# Patient Record
Sex: Male | Born: 1980 | Race: White | Hispanic: No | Marital: Married | Smoking: Never smoker
Health system: Southern US, Community
[De-identification: ages and names within clinical notes are randomized; demographics above are authoritative.]

## PROBLEM LIST (undated history)

## (undated) DIAGNOSIS — R253 Fasciculation: Secondary | ICD-10-CM

## (undated) DIAGNOSIS — F909 Attention-deficit hyperactivity disorder, unspecified type: Secondary | ICD-10-CM

## (undated) HISTORY — DX: Fasciculation: R25.3

## (undated) HISTORY — DX: Attention-deficit hyperactivity disorder, unspecified type: F90.9

## (undated) HISTORY — PX: WISDOM TOOTH EXTRACTION: SHX21

---

## 2019-01-27 ENCOUNTER — Observation Stay (HOSPITAL_COMMUNITY)
Admission: EM | Admit: 2019-01-27 | Discharge: 2019-01-28 | Disposition: A | Discharge status: Home / Self Care | Payer: Managed Care, Other (non HMO) | Attending: General Surgery | Admitting: General Surgery

## 2019-01-27 ENCOUNTER — Other Ambulatory Visit: Payer: Self-pay

## 2019-01-27 ENCOUNTER — Encounter (HOSPITAL_COMMUNITY): Payer: Self-pay | Admitting: Emergency Medicine

## 2019-01-27 ENCOUNTER — Emergency Department (HOSPITAL_COMMUNITY): Payer: Managed Care, Other (non HMO) | Admitting: Registered Nurse

## 2019-01-27 ENCOUNTER — Encounter (HOSPITAL_COMMUNITY)
Admission: EM | Disposition: A | Discharge status: Home / Self Care | Payer: Self-pay | Source: Home / Self Care | Attending: Emergency Medicine

## 2019-01-27 ENCOUNTER — Emergency Department (HOSPITAL_COMMUNITY): Payer: Managed Care, Other (non HMO)

## 2019-01-27 DIAGNOSIS — R1031 Right lower quadrant pain: Secondary | ICD-10-CM

## 2019-01-27 DIAGNOSIS — K3580 Unspecified acute appendicitis: Secondary | ICD-10-CM | POA: Diagnosis present

## 2019-01-27 DIAGNOSIS — Z1159 Encounter for screening for other viral diseases: Secondary | ICD-10-CM | POA: Insufficient documentation

## 2019-01-27 DIAGNOSIS — K3533 Acute appendicitis with perforation and localized peritonitis, with abscess: Principal | ICD-10-CM | POA: Insufficient documentation

## 2019-01-27 DIAGNOSIS — Z9049 Acquired absence of other specified parts of digestive tract: Secondary | ICD-10-CM

## 2019-01-27 HISTORY — PX: LAPAROSCOPIC APPENDECTOMY: SHX408

## 2019-01-27 LAB — CBC WITH DIFFERENTIAL/PLATELET
Abs Immature Granulocytes: 0.02 10*3/uL (ref 0.00–0.07)
Basophils Absolute: 0 10*3/uL (ref 0.0–0.1)
Basophils Relative: 0 %
Eosinophils Absolute: 0.1 10*3/uL (ref 0.0–0.5)
Eosinophils Relative: 0 %
HCT: 46.4 % (ref 39.0–52.0)
Hemoglobin: 16.6 g/dL (ref 13.0–17.0)
Immature Granulocytes: 0 %
Lymphocytes Relative: 12 %
Lymphs Abs: 1.5 10*3/uL (ref 0.7–4.0)
MCH: 31.4 pg (ref 26.0–34.0)
MCHC: 35.8 g/dL (ref 30.0–36.0)
MCV: 87.9 fL (ref 80.0–100.0)
Monocytes Absolute: 1.1 10*3/uL — ABNORMAL HIGH (ref 0.1–1.0)
Monocytes Relative: 9 %
Neutro Abs: 9.9 10*3/uL — ABNORMAL HIGH (ref 1.7–7.7)
Neutrophils Relative %: 79 %
Platelets: 192 10*3/uL (ref 150–400)
RBC: 5.28 MIL/uL (ref 4.22–5.81)
RDW: 11.8 % (ref 11.5–15.5)
WBC: 12.5 10*3/uL — ABNORMAL HIGH (ref 4.0–10.5)
nRBC: 0 % (ref 0.0–0.2)

## 2019-01-27 LAB — COMPREHENSIVE METABOLIC PANEL
ALT: 17 U/L (ref 0–44)
AST: 29 U/L (ref 15–41)
Albumin: 4.4 g/dL (ref 3.5–5.0)
Alkaline Phosphatase: 69 U/L (ref 38–126)
Anion gap: 10 (ref 5–15)
BUN: 18 mg/dL (ref 6–20)
CO2: 23 mmol/L (ref 22–32)
Calcium: 9.3 mg/dL (ref 8.9–10.3)
Chloride: 105 mmol/L (ref 98–111)
Creatinine, Ser: 1.09 mg/dL (ref 0.61–1.24)
GFR calc Af Amer: >60 mL/min (ref >60)
GFR calc non Af Amer: >60 mL/min (ref >60)
Glucose, Bld: 105 mg/dL — ABNORMAL HIGH (ref 70–99)
Potassium: 4.4 mmol/L (ref 3.5–5.1)
Sodium: 138 mmol/L (ref 135–145)
Total Bilirubin: 0.7 mg/dL (ref 0.3–1.2)
Total Protein: 7.1 g/dL (ref 6.5–8.1)

## 2019-01-27 LAB — URINALYSIS, ROUTINE W REFLEX MICROSCOPIC
Bacteria, UA: NONE SEEN
Bilirubin Urine: NEGATIVE
Glucose, UA: NEGATIVE mg/dL
Ketones, ur: 5 mg/dL — AB
Leukocytes,Ua: NEGATIVE
Nitrite: NEGATIVE
Protein, ur: NEGATIVE mg/dL
Specific Gravity, Urine: 1.015 (ref 1.005–1.030)
pH: 6 (ref 5.0–8.0)

## 2019-01-27 LAB — LIPASE, BLOOD: Lipase: 28 U/L (ref 11–51)

## 2019-01-27 LAB — SARS CORONAVIRUS 2 BY RT PCR (HOSPITAL ORDER, PERFORMED IN ~~LOC~~ HOSPITAL LAB): SARS Coronavirus 2: NEGATIVE

## 2019-01-27 SURGERY — APPENDECTOMY, LAPAROSCOPIC
Anesthesia: General | Site: Abdomen

## 2019-01-27 MED ORDER — OXYCODONE HCL 5 MG/5ML PO SOLN: 5.0000 mg | Freq: Once | ORAL | Status: AC | PRN

## 2019-01-27 MED ORDER — BUPIVACAINE-EPINEPHRINE (PF) 0.25% -1:200000 IJ SOLN
INTRAMUSCULAR | Status: AC
  Filled 2019-01-27: qty 30

## 2019-01-27 MED ORDER — SODIUM CHLORIDE 0.9 % IR SOLN
Status: DC | PRN
  Administered 2019-01-27: 1000 mL

## 2019-01-27 MED ORDER — MORPHINE SULFATE (PF) 4 MG/ML IV SOLN
INTRAVENOUS | Status: AC
  Filled 2019-01-27: qty 1

## 2019-01-27 MED ORDER — ONDANSETRON HCL 4 MG/2ML IJ SOLN
INTRAMUSCULAR | Status: DC | PRN
  Administered 2019-01-27: 4 mg via INTRAVENOUS

## 2019-01-27 MED ORDER — EPHEDRINE 5 MG/ML INJ
INTRAVENOUS | Status: AC
  Filled 2019-01-27: qty 10

## 2019-01-27 MED ORDER — ROCURONIUM BROMIDE 10 MG/ML (PF) SYRINGE
PREFILLED_SYRINGE | INTRAVENOUS | Status: DC | PRN
  Administered 2019-01-27: 10 mg via INTRAVENOUS
  Administered 2019-01-27: 40 mg via INTRAVENOUS

## 2019-01-27 MED ORDER — DEXMEDETOMIDINE HCL 200 MCG/2ML IV SOLN
INTRAVENOUS | Status: DC | PRN
  Administered 2019-01-27: 4 ug via INTRAVENOUS
  Administered 2019-01-27: 8 ug via INTRAVENOUS
  Administered 2019-01-27 (×2): 4 ug via INTRAVENOUS

## 2019-01-27 MED ORDER — LACTATED RINGERS IV SOLN
INTRAVENOUS | Status: DC | PRN
  Administered 2019-01-27 (×2): via INTRAVENOUS

## 2019-01-27 MED ORDER — LIDOCAINE 2% (20 MG/ML) 5 ML SYRINGE
INTRAMUSCULAR | Status: DC | PRN
  Administered 2019-01-27: 60 mg via INTRAVENOUS

## 2019-01-27 MED ORDER — KCL IN DEXTROSE-NACL 20-5-0.45 MEQ/L-%-% IV SOLN
INTRAVENOUS | Status: DC
  Administered 2019-01-27: 20:00:00 via INTRAVENOUS
  Filled 2019-01-27: qty 1000

## 2019-01-27 MED ORDER — ZOLPIDEM TARTRATE 5 MG PO TABS: 5.0000 mg | ORAL_TABLET | Freq: Every evening | ORAL | Status: DC | PRN

## 2019-01-27 MED ORDER — PROMETHAZINE HCL 25 MG/ML IJ SOLN: 6.2500 mg | INTRAMUSCULAR | Status: DC | PRN

## 2019-01-27 MED ORDER — ARTIFICIAL TEARS OPHTHALMIC OINT
TOPICAL_OINTMENT | OPHTHALMIC | Status: AC
  Filled 2019-01-27: qty 3.5

## 2019-01-27 MED ORDER — DIPHENHYDRAMINE HCL 25 MG PO CAPS: 25.0000 mg | ORAL_CAPSULE | Freq: Four times a day (QID) | ORAL | Status: DC | PRN

## 2019-01-27 MED ORDER — DEXAMETHASONE SODIUM PHOSPHATE 10 MG/ML IJ SOLN
INTRAMUSCULAR | Status: DC | PRN
  Administered 2019-01-27: 10 mg via INTRAVENOUS

## 2019-01-27 MED ORDER — ONDANSETRON 4 MG PO TBDP: 4.0000 mg | ORAL_TABLET | Freq: Four times a day (QID) | ORAL | Status: DC | PRN

## 2019-01-27 MED ORDER — CIPROFLOXACIN IN D5W 400 MG/200ML IV SOLN
400.0000 mg | Freq: Once | INTRAVENOUS | Status: AC
  Administered 2019-01-27: 400 mg via INTRAVENOUS
  Filled 2019-01-27: qty 200

## 2019-01-27 MED ORDER — ROCURONIUM BROMIDE 10 MG/ML (PF) SYRINGE
PREFILLED_SYRINGE | INTRAVENOUS | Status: AC
  Filled 2019-01-27: qty 10

## 2019-01-27 MED ORDER — DIPHENHYDRAMINE HCL 50 MG/ML IJ SOLN: 25.0000 mg | Freq: Four times a day (QID) | INTRAMUSCULAR | Status: DC | PRN

## 2019-01-27 MED ORDER — ONDANSETRON HCL 4 MG/2ML IJ SOLN
INTRAMUSCULAR | Status: AC
  Filled 2019-01-27: qty 2

## 2019-01-27 MED ORDER — SUCCINYLCHOLINE CHLORIDE 200 MG/10ML IV SOSY
PREFILLED_SYRINGE | INTRAVENOUS | Status: AC
  Filled 2019-01-27: qty 10

## 2019-01-27 MED ORDER — METRONIDAZOLE IN NACL 5-0.79 MG/ML-% IV SOLN
500.0000 mg | Freq: Once | INTRAVENOUS | Status: AC
  Administered 2019-01-28: 500 mg via INTRAVENOUS
  Filled 2019-01-27: qty 100

## 2019-01-27 MED ORDER — DEXAMETHASONE SODIUM PHOSPHATE 10 MG/ML IJ SOLN
INTRAMUSCULAR | Status: AC
  Filled 2019-01-27: qty 1

## 2019-01-27 MED ORDER — FENTANYL CITRATE (PF) 100 MCG/2ML IJ SOLN
25.0000 ug | INTRAMUSCULAR | Status: DC | PRN
  Administered 2019-01-27: 25 ug via INTRAVENOUS
  Administered 2019-01-27: 50 ug via INTRAVENOUS

## 2019-01-27 MED ORDER — CIPROFLOXACIN IN D5W 400 MG/200ML IV SOLN
400.0000 mg | Freq: Once | INTRAVENOUS | Status: AC
  Administered 2019-01-28: 400 mg via INTRAVENOUS
  Filled 2019-01-27 (×2): qty 200

## 2019-01-27 MED ORDER — MIDAZOLAM HCL 2 MG/2ML IJ SOLN
INTRAMUSCULAR | Status: AC
  Filled 2019-01-27: qty 2

## 2019-01-27 MED ORDER — ENOXAPARIN SODIUM 40 MG/0.4ML ~~LOC~~ SOLN: 40.0000 mg | SUBCUTANEOUS | Status: DC

## 2019-01-27 MED ORDER — OXYCODONE HCL 5 MG PO TABS
ORAL_TABLET | ORAL | Status: AC
  Filled 2019-01-27: qty 1

## 2019-01-27 MED ORDER — SUCCINYLCHOLINE CHLORIDE 200 MG/10ML IV SOSY
PREFILLED_SYRINGE | INTRAVENOUS | Status: DC | PRN
  Administered 2019-01-27: 100 mg via INTRAVENOUS

## 2019-01-27 MED ORDER — DEXMEDETOMIDINE HCL IN NACL 80 MCG/20ML IV SOLN
INTRAVENOUS | Status: AC
  Filled 2019-01-27: qty 20

## 2019-01-27 MED ORDER — METRONIDAZOLE IN NACL 5-0.79 MG/ML-% IV SOLN
500.0000 mg | Freq: Once | INTRAVENOUS | Status: AC
  Administered 2019-01-27: 500 mg via INTRAVENOUS
  Filled 2019-01-27: qty 100

## 2019-01-27 MED ORDER — OXYCODONE HCL 5 MG PO TABS
5.0000 mg | ORAL_TABLET | ORAL | Status: DC | PRN
  Administered 2019-01-27 – 2019-01-28 (×3): 10 mg via ORAL
  Filled 2019-01-27 (×3): qty 2

## 2019-01-27 MED ORDER — TRAMADOL HCL 50 MG PO TABS
50.0000 mg | ORAL_TABLET | Freq: Four times a day (QID) | ORAL | Status: DC | PRN
  Administered 2019-01-28: 50 mg via ORAL
  Filled 2019-01-27: qty 1

## 2019-01-27 MED ORDER — PROPOFOL 10 MG/ML IV BOLUS
INTRAVENOUS | Status: AC
  Filled 2019-01-27: qty 20

## 2019-01-27 MED ORDER — METHOCARBAMOL 500 MG PO TABS
500.0000 mg | ORAL_TABLET | Freq: Four times a day (QID) | ORAL | Status: DC | PRN
  Administered 2019-01-28: 500 mg via ORAL
  Filled 2019-01-27: qty 1

## 2019-01-27 MED ORDER — MORPHINE SULFATE (PF) 4 MG/ML IV SOLN
4.0000 mg | INTRAVENOUS | Status: DC | PRN
  Filled 2019-01-27: qty 1

## 2019-01-27 MED ORDER — CELECOXIB 200 MG PO CAPS
200.0000 mg | ORAL_CAPSULE | Freq: Two times a day (BID) | ORAL | Status: DC
  Administered 2019-01-27 – 2019-01-28 (×2): 200 mg via ORAL
  Filled 2019-01-27 (×2): qty 1

## 2019-01-27 MED ORDER — 0.9 % SODIUM CHLORIDE (POUR BTL) OPTIME
TOPICAL | Status: DC | PRN
  Administered 2019-01-27: 1000 mL

## 2019-01-27 MED ORDER — PROPOFOL 10 MG/ML IV BOLUS
INTRAVENOUS | Status: DC | PRN
  Administered 2019-01-27: 180 mg via INTRAVENOUS

## 2019-01-27 MED ORDER — FENTANYL CITRATE (PF) 250 MCG/5ML IJ SOLN
INTRAMUSCULAR | Status: DC | PRN
  Administered 2019-01-27 (×3): 50 ug via INTRAVENOUS

## 2019-01-27 MED ORDER — OXYCODONE HCL 5 MG PO TABS
5.0000 mg | ORAL_TABLET | Freq: Once | ORAL | Status: AC | PRN
  Administered 2019-01-27: 5 mg via ORAL

## 2019-01-27 MED ORDER — GABAPENTIN 300 MG PO CAPS
300.0000 mg | ORAL_CAPSULE | Freq: Two times a day (BID) | ORAL | Status: DC
  Administered 2019-01-27 – 2019-01-28 (×2): 300 mg via ORAL
  Filled 2019-01-27 (×2): qty 1

## 2019-01-27 MED ORDER — HYDROMORPHONE HCL 1 MG/ML IJ SOLN
1.0000 mg | Freq: Once | INTRAMUSCULAR | Status: AC
  Administered 2019-01-27: 1 mg via INTRAVENOUS
  Filled 2019-01-27: qty 1

## 2019-01-27 MED ORDER — SUGAMMADEX SODIUM 200 MG/2ML IV SOLN
INTRAVENOUS | Status: DC | PRN
  Administered 2019-01-27 (×2): 100 mg via INTRAVENOUS

## 2019-01-27 MED ORDER — FENTANYL CITRATE (PF) 100 MCG/2ML IJ SOLN
25.0000 ug | Freq: Once | INTRAMUSCULAR | Status: AC
  Administered 2019-01-27: 25 ug via INTRAVENOUS
  Filled 2019-01-27: qty 2

## 2019-01-27 MED ORDER — IOHEXOL 300 MG/ML  SOLN
100.0000 mL | Freq: Once | INTRAMUSCULAR | Status: AC | PRN
  Administered 2019-01-27: 100 mL via INTRAVENOUS

## 2019-01-27 MED ORDER — SODIUM CHLORIDE 0.9 % IV BOLUS
1000.0000 mL | Freq: Once | INTRAVENOUS | Status: AC
  Administered 2019-01-27: 1000 mL via INTRAVENOUS

## 2019-01-27 MED ORDER — BUPIVACAINE-EPINEPHRINE 0.25% -1:200000 IJ SOLN
INTRAMUSCULAR | Status: DC | PRN
  Administered 2019-01-27: 10 mL

## 2019-01-27 MED ORDER — ONDANSETRON HCL 4 MG/2ML IJ SOLN: 4.0000 mg | Freq: Four times a day (QID) | INTRAMUSCULAR | Status: DC | PRN

## 2019-01-27 MED ORDER — LIDOCAINE 2% (20 MG/ML) 5 ML SYRINGE
INTRAMUSCULAR | Status: AC
  Filled 2019-01-27: qty 5

## 2019-01-27 MED ORDER — ARTIFICIAL TEARS OPHTHALMIC OINT
TOPICAL_OINTMENT | OPHTHALMIC | Status: DC | PRN
  Administered 2019-01-27: 1 via OPHTHALMIC

## 2019-01-27 MED ORDER — PHENYLEPHRINE 40 MCG/ML (10ML) SYRINGE FOR IV PUSH (FOR BLOOD PRESSURE SUPPORT)
PREFILLED_SYRINGE | INTRAVENOUS | Status: AC
  Filled 2019-01-27: qty 10

## 2019-01-27 MED ORDER — MIDAZOLAM HCL 5 MG/5ML IJ SOLN
INTRAMUSCULAR | Status: DC | PRN
  Administered 2019-01-27: 2 mg via INTRAVENOUS

## 2019-01-27 MED ORDER — MORPHINE SULFATE (PF) 4 MG/ML IV SOLN
4.0000 mg | Freq: Once | INTRAVENOUS | Status: AC
  Administered 2019-01-27: 4 mg via INTRAVENOUS
  Filled 2019-01-27: qty 1

## 2019-01-27 MED ORDER — FENTANYL CITRATE (PF) 250 MCG/5ML IJ SOLN
INTRAMUSCULAR | Status: AC
  Filled 2019-01-27: qty 5

## 2019-01-27 MED ORDER — FENTANYL CITRATE (PF) 100 MCG/2ML IJ SOLN
INTRAMUSCULAR | Status: AC
  Filled 2019-01-27: qty 2

## 2019-01-27 SURGICAL SUPPLY — 42 items
APPLIER CLIP ROT 10 11.4 M/L (STAPLE)
BLADE CLIPPER SURG (BLADE) ×2 IMPLANT
CANISTER SUCT 3000ML PPV (MISCELLANEOUS) ×2 IMPLANT
CHLORAPREP W/TINT 26 (MISCELLANEOUS) ×2 IMPLANT
CLIP APPLIE ROT 10 11.4 M/L (STAPLE) IMPLANT
COVER SURGICAL LIGHT HANDLE (MISCELLANEOUS) ×2 IMPLANT
COVER WAND RF STERILE (DRAPES) ×2 IMPLANT
CUTTER FLEX LINEAR 45M (STAPLE) ×2 IMPLANT
DERMABOND ADVANCED (GAUZE/BANDAGES/DRESSINGS) ×1
DERMABOND ADVANCED .7 DNX12 (GAUZE/BANDAGES/DRESSINGS) ×1 IMPLANT
ELECT REM PT RETURN 9FT ADLT (ELECTROSURGICAL) ×2
ELECTRODE REM PT RTRN 9FT ADLT (ELECTROSURGICAL) ×1 IMPLANT
GLOVE BIO SURGEON STRL SZ8 (GLOVE) ×2 IMPLANT
GLOVE BIOGEL PI IND STRL 8 (GLOVE) ×1 IMPLANT
GLOVE BIOGEL PI INDICATOR 8 (GLOVE) ×1
GOWN STRL REUS W/ TWL LRG LVL3 (GOWN DISPOSABLE) ×2 IMPLANT
GOWN STRL REUS W/ TWL XL LVL3 (GOWN DISPOSABLE) ×1 IMPLANT
GOWN STRL REUS W/TWL LRG LVL3 (GOWN DISPOSABLE) ×2
GOWN STRL REUS W/TWL XL LVL3 (GOWN DISPOSABLE) ×1
KIT BASIN OR (CUSTOM PROCEDURE TRAY) ×2 IMPLANT
KIT TURNOVER KIT B (KITS) ×2 IMPLANT
NEEDLE 22X1 1/2 (OR ONLY) (NEEDLE) ×2 IMPLANT
NS IRRIG 1000ML POUR BTL (IV SOLUTION) ×2 IMPLANT
PAD ARMBOARD 7.5X6 YLW CONV (MISCELLANEOUS) ×4 IMPLANT
POUCH RETRIEVAL ECOSAC 10 (ENDOMECHANICALS) ×1 IMPLANT
POUCH RETRIEVAL ECOSAC 10MM (ENDOMECHANICALS) ×1
RELOAD 45 VASCULAR/THIN (ENDOMECHANICALS) ×2 IMPLANT
RELOAD STAPLE TA45 3.5 REG BLU (ENDOMECHANICALS) ×2 IMPLANT
SCISSORS LAP 5X35 DISP (ENDOMECHANICALS) IMPLANT
SET IRRIG TUBING LAPAROSCOPIC (IRRIGATION / IRRIGATOR) ×2 IMPLANT
SET TUBE SMOKE EVAC HIGH FLOW (TUBING) ×2 IMPLANT
SHEARS HARMONIC ACE PLUS 36CM (ENDOMECHANICALS) ×2 IMPLANT
SPECIMEN JAR SMALL (MISCELLANEOUS) ×2 IMPLANT
SUT VIC AB 4-0 PS2 27 (SUTURE) ×2 IMPLANT
TOWEL GREEN STERILE (TOWEL DISPOSABLE) ×2 IMPLANT
TOWEL GREEN STERILE FF (TOWEL DISPOSABLE) ×2 IMPLANT
TRAY FOLEY CATH 16FR SILVER (SET/KITS/TRAYS/PACK) ×2 IMPLANT
TRAY LAPAROSCOPIC MC (CUSTOM PROCEDURE TRAY) ×2 IMPLANT
TROCAR XCEL 12X100 BLDLESS (ENDOMECHANICALS) ×2 IMPLANT
TROCAR XCEL BLUNT TIP 100MML (ENDOMECHANICALS) ×2 IMPLANT
TROCAR XCEL NON-BLD 5MMX100MML (ENDOMECHANICALS) ×2 IMPLANT
WATER STERILE IRR 1000ML POUR (IV SOLUTION) ×2 IMPLANT

## 2019-01-27 NOTE — Anesthesia Preprocedure Evaluation (Addendum)
Anesthesia Evaluation  Patient identified by MRN, date of birth, ID band Patient awake    Reviewed: Allergy & Precautions, NPO status , Patient's Chart, lab work & pertinent test results  History of Anesthesia Complications Negative for: history of anesthetic complications  Airway Mallampati: I  TM Distance: >3 FB Neck ROM: Full    Dental  (+) Dental Advisory Given, Teeth Intact   Pulmonary neg pulmonary ROS,    breath sounds clear to auscultation       Cardiovascular negative cardio ROS   Rhythm:Regular Rate:Normal     Neuro/Psych negative neurological ROS  negative psych ROS   GI/Hepatic Neg liver ROS,  Appendicitis    Endo/Other  negative endocrine ROS  Renal/GU negative Renal ROS     Musculoskeletal negative musculoskeletal ROS (+)   Abdominal   Peds  Hematology negative hematology ROS (+)   Anesthesia Other Findings   Reproductive/Obstetrics                            Anesthesia Physical Anesthesia Plan  ASA: I and emergent  Anesthesia Plan: General   Post-op Pain Management:    Induction: Intravenous and Rapid sequence  PONV Risk Score and Plan: 3 and Treatment may vary due to age or medical condition, Ondansetron, Dexamethasone and Midazolam  Airway Management Planned: Oral ETT  Additional Equipment: None  Intra-op Plan:   Post-operative Plan: Extubation in OR  Informed Consent: I have reviewed the patients History and Physical, chart, labs and discussed the procedure including the risks, benefits and alternatives for the proposed anesthesia with the patient or authorized representative who has indicated his/her understanding and acceptance.     Dental advisory given  Plan Discussed with: CRNA and Anesthesiologist  Anesthesia Plan Comments:        Anesthesia Quick Evaluation

## 2019-01-27 NOTE — Op Note (Signed)
01/27/2019  5:24 PM  PATIENT:  Jordan Klein  38 y.o. male  PRE-OPERATIVE DIAGNOSIS:  acute appendicitis  POST-OPERATIVE DIAGNOSIS:  suppurative appendicitis  PROCEDURE:  Procedure(s): APPENDECTOMY LAPAROSCOPIC  SURGEON:  Surgeon(s): Georganna Skeans, MD  ASSISTANTS: none   ANESTHESIA:   local and general  EBL:  Total I/O In: 1000 [IV Piggyback:1000] Out: 20 [Blood:20]  BLOOD ADMINISTERED:none  DRAINS: none   SPECIMEN:  Excision  DISPOSITION OF SPECIMEN:  PATHOLOGY  COUNTS:  YES  DICTATION: .Dragon Dictation Findings: Acute suppurative appendicitis without perforation  Procedure in detail: Informed consent was obtained.  He received intravenous antibiotics.  He was brought to the operating room and general endotracheal anesthesia was administered by the anesthesia staff.  His abdomen was prepped and draped in a sterile fashion.  A timeout procedure was performed.The infraumbilical region was infiltrated with local. Infraumbilical incision was made. Subcutaneous tissues were dissected down revealing the anterior fascia. This was divided sharply along the midline. Peritoneal cavity was entered under direct vision without complication. A 0 Vicryl pursestring was placed around the fascial opening. Hassan trocar was inserted into the abdomen. The abdomen was insufflated with carbon dioxide in standard fashion. Under direct vision a 12 mm left lower quadrant and 5 mm right abdominal port were placed.  Local was used at each port site.  Laparoscopic exploration revealed a very inflamed, but not perforated appendicitis.  The appendix was freed up from some mild adhesions to the lateral abdominal wall.  The mesoappendix was then gradually divided with a harmonic scalpel achieving excellent hemostasis.  The base of the appendix was divided with Endo GIA with a vascular load.  The appendix was placed in the bag and removed from the abdomen.  It was sent to pathology.  The abdomen was then  copiously irrigated.  The staple line was intact.  There was no bleeding.  Irrigation fluid was evacuated.  Laparoscopic exploration revealed no complicating features.  Ports were removed under direct vision.  Pneumoperitoneum was released.  Infraumbilical fascia was closed by tying the pursestring.  All 3 wounds were irrigated and the skin of each was closed with running 4-0 Vicryl followed by Dermabond.  All counts were correct.  He tolerated the procedure well that apparent complication and was taken recovery in stable condition.  PATIENT DISPOSITION:  PACU - hemodynamically stable.   Delay start of Pharmacological VTE agent (>24hrs) due to surgical blood loss or risk of bleeding:  no  Georganna Skeans, MD, MPH, FACS Pager: [phone]  7/11/20205:24 PM

## 2019-01-27 NOTE — H&P (Signed)
Jordan Klein is an 38 y.o. male.   Chief Complaint: RLQ pain HPI: Otherwise healthy [name] woke up with RLQ pain at 6am this morning. It has persisted. No N/V. He came to the ED for evaluation. WBC 12.5. CT A/P shows acute appendicitis without evidence of perforation. I was asked to see him for surgical care. He has not been sick or had fever recently. No contact with known COVID patients.    History reviewed. No pertinent past medical history.  History reviewed. No pertinent surgical history.  No family history on file. Social History:  reports that he has never smoked. He has never used smokeless tobacco. He reports current alcohol use of about 1.0 standard drinks of alcohol per week. He reports that he does not use drugs.  Allergies:  Allergies  Allergen Reactions  . Banana Anaphylaxis, Itching and Swelling  . Kiwi Extract Anaphylaxis, Hives and Itching  . Other Anaphylaxis, Itching and Swelling    Walnuts  . Pineapple Anaphylaxis, Itching and Swelling  . Penicillins Other (See Comments)    Did it involve swelling of the face/tongue/throat, SOB, or low BP? Unknown Did it involve sudden or severe rash/hives, skin peeling, or any reaction on the inside of your mouth or nose? Unknown Did you need to seek medical attention at a hospital or doctor's office? Unknown When did it last happen? CHILDHOOD If all above answers are "NO", may proceed with cephalosporin use.     (Not in a hospital admission)   Results for orders placed or performed during the hospital encounter of 01/27/19 (from the past 48 hour(s))  CBC with Differential     Status: Abnormal   Collection Time: 01/27/19 10:42 AM  Result Value Ref Range   WBC 12.5 (H) 4.0 - 10.5 K/uL   RBC 5.28 4.22 - 5.81 MIL/uL   Hemoglobin 16.6 13.0 - 17.0 g/dL   HCT 16.146.4 09.639.0 - 04.552.0 %   MCV 87.9 80.0 - 100.0 fL   MCH 31.4 26.0 - 34.0 pg   MCHC 35.8 30.0 - 36.0 g/dL   RDW 40.911.8 81.111.5 - 91.415.5 %   Platelets 192 150 - 400 K/uL    nRBC 0.0 0.0 - 0.2 %   Neutrophils Relative % 79 %   Neutro Abs 9.9 (H) 1.7 - 7.7 K/uL   Lymphocytes Relative 12 %   Lymphs Abs 1.5 0.7 - 4.0 K/uL   Monocytes Relative 9 %   Monocytes Absolute 1.1 (H) 0.1 - 1.0 K/uL   Eosinophils Relative 0 %   Eosinophils Absolute 0.1 0.0 - 0.5 K/uL   Basophils Relative 0 %   Basophils Absolute 0.0 0.0 - 0.1 K/uL   Immature Granulocytes 0 %   Abs Immature Granulocytes 0.02 0.00 - 0.07 K/uL    Comment: Performed at [facility], [address] [place], [place] [address]  Comprehensive metabolic panel     Status: Abnormal   Collection Time: 01/27/19 10:42 AM  Result Value Ref Range   Sodium 138 135 - 145 mmol/L   Potassium 4.4 3.5 - 5.1 mmol/L    Comment: SLIGHT HEMOLYSIS   Chloride 105 98 - 111 mmol/L   CO2 23 22 - 32 mmol/L   Glucose, Bld 105 (H) 70 - 99 mg/dL   BUN 18 6 - 20 mg/dL   Creatinine, Ser 6.211.09 0.61 - 1.24 mg/dL   Calcium 9.3 8.9 - 30.810.3 mg/dL   Total Protein 7.1 6.5 - 8.1 g/dL   Albumin 4.4 3.5 -  5.0 g/dL   AST 29 15 - 41 U/L   ALT 17 0 - 44 U/L   Alkaline Phosphatase 69 38 - 126 U/L   Total Bilirubin 0.7 0.3 - 1.2 mg/dL   GFR calc non Af Amer >60 >60 mL/min   GFR calc Af Amer >60 >60 mL/min   Anion gap 10 5 - 15    Comment: Performed at [facility], [address] [place], [place] [address]  Lipase, blood     Status: None   Collection Time: 01/27/19 10:42 AM  Result Value Ref Range   Lipase 28 11 - 51 U/L    Comment: Performed at [facility], [address] [place], [place] [address]  Urinalysis, Routine w reflex microscopic     Status: Abnormal   Collection Time: 01/27/19  2:00 PM  Result Value Ref Range   Color, Urine STRAW (A) YELLOW   APPearance CLEAR CLEAR   Specific Gravity, Urine 1.015 1.005 - 1.030   pH 6.0 5.0 - 8.0   Glucose, UA NEGATIVE NEGATIVE mg/dL   Hgb urine dipstick SMALL (A) NEGATIVE   Bilirubin Urine NEGATIVE NEGATIVE   Ketones, ur 5 (A) NEGATIVE mg/dL   Protein, ur NEGATIVE  NEGATIVE mg/dL   Nitrite NEGATIVE NEGATIVE   Leukocytes,Ua NEGATIVE NEGATIVE   RBC / HPF 0-5 0 - 5 RBC/hpf   Bacteria, UA NONE SEEN NONE SEEN   Mucus PRESENT     Comment: Performed at [facility], [address] [place], [place] [address]   Ct Abdomen Pelvis W Contrast  Result Date: 01/27/2019 CLINICAL DATA:  Abdominal pain EXAM: CT ABDOMEN AND PELVIS WITH CONTRAST TECHNIQUE: Multidetector CT imaging of the abdomen and pelvis was performed using the standard protocol following bolus administration of intravenous contrast. CONTRAST:  100mL OMNIPAQUE IOHEXOL 300 MG/ML  SOLN COMPARISON:  None. FINDINGS: Lower chest: Lung bases are clear. Hepatobiliary: No focal liver lesions are appreciable. The gallbladder wall is not appreciably thickened. There is no biliary duct dilatation. Pancreas: There is no pancreatic mass or inflammatory focus. Spleen: No splenic lesions are evident. Adrenals/Urinary Tract: Adrenals bilaterally appear normal. Kidneys bilaterally show no evident mass or hydronephrosis on either side. There are fetal lobulations bilaterally, an anatomic variant. There is no evident renal or ureteral calculus on either side. Urinary bladder is midline with wall thickness within normal limits. Stomach/Bowel: There is no appreciable bowel wall or mesenteric thickening. There is no evident bowel obstruction. No free air or portal venous air. The terminal ileum appears unremarkable. Vascular/Lymphatic: No abdominal aortic aneurysm. No vascular lesions evident. No adenopathy is appreciable in the abdomen or pelvis. Reproductive: Prostate is mildly prominent for age with mild generalized increase in attenuation. Seminal vesicles appear unremarkable. No evident pelvic mass. Other: Appendix measures 13 mm proximally. There are several foci of calcification in the appendix. There is mild appendiceal wall thickening and enhancement. There is no periappendiceal region fluid or abscess. No perforation  evident. No abscess or ascites evident in the abdomen or pelvis. Musculoskeletal: There are no blastic or lytic bone lesions. There is no intramuscular or abdominal wall lesion. IMPRESSION: 1.  Findings indicative of a degree of acute appendicitis. Appendix: Location: Appendix arises inferiorly from the cecum and tracks anterior to the psoas muscle at the level of the mid sacrum. Diameter: 13 mm proximally with tapering more distally. Appendicolith: Present proximally and in the mid appendix. Mucosal hyper-enhancement: Slight Extraluminal gas: None Periappendiceal collection: None. Subtle soft tissue  stranding adjacent to the proximal appendix. 2. Prostate mildly prominent for age with increased attenuation. No focal prostatic lesion evident. This finding may warrant PSA correlation and clinical assessment. 3.  No bowel obstruction.  No abscess in the abdomen or pelvis. 4. No renal or ureteral calculus. No hydronephrosis. Urinary bladder wall thickness within normal limits. Critical Value/emergent results were called by telephone at the time of interpretation on 01/27/2019 at 1:24 pm to Dr. Virgel Manifold , who verbally acknowledged these results. Electronically Signed   By: Lowella Grip III M.D.   On: 01/27/2019 13:24    Review of Systems  Constitutional: Negative for chills and fever.  HENT: Negative.   Eyes: Negative.   Respiratory: Negative.   Cardiovascular: Negative.   Gastrointestinal: Positive for abdominal pain. Negative for constipation, diarrhea, nausea and vomiting.  Genitourinary: Negative.   Musculoskeletal: Negative.   Skin: Negative.   Neurological: Negative.   Endo/Heme/Allergies: Negative.   Psychiatric/Behavioral: Negative.     Blood pressure (!) 100/56, pulse 87, temperature 98.2 F (36.8 C), temperature source Oral, resp. rate 16, height 5\' 6"  (1.676 m), weight 59 kg, SpO2 99 %. Physical Exam  Constitutional: He is oriented to person, place, and time. He appears  well-developed and well-nourished. No distress.  HENT:  Head: Normocephalic.  Right Ear: External ear normal.  Left Ear: External ear normal.  Nose: Nose normal.  Mouth/Throat: Oropharynx is clear and moist.  Eyes: EOM are normal. Right eye exhibits no discharge. Left eye exhibits no discharge.  Neck: Neck supple. No tracheal deviation present. No thyromegaly present.  Cardiovascular: Normal rate, regular rhythm and normal heart sounds.  Respiratory: Effort normal and breath sounds normal. No respiratory distress. He has no wheezes. He has no rales.  GI: Soft. He exhibits no distension. There is abdominal tenderness. There is no rebound and no guarding.  Quite tender RLQ without peritonitis  Musculoskeletal: Normal range of motion.        General: No edema.  Neurological: He is alert and oriented to person, place, and time.  Skin: Skin is warm.  Psychiatric: He has a normal mood and affect.     Assessment/Plan Acute appendicitis - IV Cipro and Flagyl. To OR for laparoscopic appendectomy once COVID test completed. Procedure, risks, and benefits discussed and he agrees.  Zenovia Jarred, MD 01/27/2019, 2:33 PM

## 2019-01-27 NOTE — Anesthesia Procedure Notes (Signed)
Procedure Name: Intubation Date/Time: 01/27/2019 4:40 PM Performed by: Jearld Pies, CRNA Pre-anesthesia Checklist: Patient identified, Emergency Drugs available, Suction available and Patient being monitored Patient Re-evaluated:Patient Re-evaluated prior to induction Oxygen Delivery Method: Circle System Utilized Preoxygenation: Pre-oxygenation with 100% oxygen Induction Type: IV induction, Rapid sequence and Cricoid Pressure applied Laryngoscope Size: Mac and 3 Grade View: Grade I Tube type: Oral Tube size: 7.0 mm Number of attempts: 1 Airway Equipment and Method: Stylet and Oral airway Placement Confirmation: ETT inserted through vocal cords under direct vision,  positive ETCO2 and breath sounds checked- equal and bilateral Secured at: 21 cm Tube secured with: Tape Dental Injury: Teeth and Oropharynx as per pre-operative assessment

## 2019-01-27 NOTE — Transfer of Care (Signed)
Immediate Anesthesia Transfer of Care Note  Patient: [name]  Procedure(s) Performed: APPENDECTOMY LAPAROSCOPIC (N/A Abdomen)  Patient Location: PACU  Anesthesia Type:General  Level of Consciousness: drowsy and responds to stimulation  Airway & Oxygen Therapy: Patient Spontanous Breathing and Patient connected to face mask oxygen  Post-op Assessment: Report given to RN and Post -op Vital signs reviewed and stable  Post vital signs: Reviewed and stable  Last Vitals:  Vitals Value Taken Time  BP 98/59 01/27/19 1736  Temp    Pulse 85 01/27/19 1737  Resp 18 01/27/19 1737  SpO2 100 % 01/27/19 1737  Vitals shown include unvalidated device data.  Last Pain:  Vitals:   01/27/19 1456  TempSrc:   PainSc: 6          Complications: No apparent anesthesia complications

## 2019-01-27 NOTE — Anesthesia Postprocedure Evaluation (Signed)
Anesthesia Post Note  Patient: Jordan Klein  Procedure(s) Performed: APPENDECTOMY LAPAROSCOPIC (N/A Abdomen)     Patient location during evaluation: PACU Anesthesia Type: General Level of consciousness: awake and alert Pain management: pain level controlled Vital Signs Assessment: post-procedure vital signs reviewed and stable Respiratory status: spontaneous breathing, nonlabored ventilation and respiratory function stable Cardiovascular status: blood pressure returned to baseline and stable Postop Assessment: no apparent nausea or vomiting Anesthetic complications: no    Last Vitals:  Vitals:   01/27/19 1832 01/27/19 2056  BP: 108/67 115/75  Pulse: 84 88  Resp: 12 16  Temp: 36.8 C 37 C  SpO2: 99% 96%    Last Pain:  Vitals:   01/27/19 2056  TempSrc: Oral  PainSc:                  Audry Pili

## 2019-01-27 NOTE — ED Provider Notes (Signed)
[facility]   CSN: [id number] Arrival date & time: 01/27/19  1005    History   Chief Complaint Chief Complaint  Patient presents with   Abdominal Pain    HPI Jordan Klein is a 38 y.o. male with no significant past medical history presenting with constant sharp periumbilical abdominal pain radiating to the RLQ onset today at 6am. Patient reports pain woke him up from sleeping. Patient states nothing makes symptoms better or worse. Patient denies taking any medications. Patient denies nausea, vomiting, diarrhea, or constipation. Patient reports last BM was this morning and it was normal. Patient reports he last ate this morning at 7:30am. Patient denies any abdominal surgeries. Patient denies tobacco or drug use. Patient reports occasional alcohol use once a week. Patient denies any alcohol use today. Patient denies hematuria, dysuria, or urinary frequency. Patient states he does not have a PCP. Patient denies fever, cough, congestion, sick exposures, or recent travel.      HPI  History reviewed. No pertinent past medical history.  There are no active problems to display for this patient.   History reviewed. No pertinent surgical history.      Home Medications    Prior to Admission medications   Not on File    Family History History reviewed. No pertinent family history.  Social History Social History   Tobacco Use   Smoking status: Never Smoker   Smokeless tobacco: Never Used  Substance Use Topics   Alcohol use: Yes    Alcohol/week: 1.0 standard drinks    Types: 1 Cans of beer per week   Drug use: Never     Allergies   Banana, Kiwi extract, Other, Pineapple, and Penicillins   Review of Systems Review of Systems  Constitutional: Negative for activity change, appetite change, chills, fever and unexpected weight change.  HENT: Negative for congestion, rhinorrhea and sore throat.   Eyes: Negative  for visual disturbance.  Respiratory: Negative for cough and shortness of breath.   Cardiovascular: Negative for chest pain.  Gastrointestinal: Positive for abdominal pain. Negative for constipation, diarrhea, nausea and vomiting.  Endocrine: Negative for polydipsia, polyphagia and polyuria.  Genitourinary: Negative for dysuria, flank pain and frequency.  Musculoskeletal: Negative for back pain.  Skin: Negative for rash.  Allergic/Immunologic: Negative for immunocompromised state.  Psychiatric/Behavioral: The patient is not nervous/anxious.      Physical Exam Updated Vital Signs BP 111/79    Pulse 73    Temp 98.2 F (36.8 C) (Oral)    Resp 16    Ht 5\' 6"  (1.676 m)    Wt 59 kg    SpO2 95%    BMI 20.98 kg/m   Physical Exam Vitals signs and nursing note reviewed.  Constitutional:      General: He is not in acute distress.    Appearance: He is well-developed. He is not diaphoretic.     Comments: Patient appears uncomfortable during exam due to abdominal pain.  HENT:     Head: Normocephalic and atraumatic.     Mouth/Throat:     Mouth: Mucous membranes are moist.     Pharynx: No pharyngeal swelling or oropharyngeal exudate.  Eyes:     General: No scleral icterus. Neck:     Musculoskeletal: Normal range of motion and neck supple.  Cardiovascular:     Rate and Rhythm: Normal rate and regular rhythm.     Heart sounds: Normal heart sounds. No murmur. No friction rub. No gallop.  Pulmonary:     Effort: Pulmonary effort is normal. No respiratory distress.     Breath sounds: Normal breath sounds. No wheezing or rales.  Abdominal:     General: Abdomen is flat. Bowel sounds are normal. There is no distension.     Palpations: Abdomen is soft. Abdomen is not rigid. There is no mass.     Tenderness: There is abdominal tenderness in the right lower quadrant. There is guarding. There is no right CVA tenderness, left CVA tenderness or rebound. Positive signs include McBurney's sign. Negative  signs include Murphy's sign.     Hernia: No hernia is present.  Musculoskeletal: Normal range of motion.  Skin:    General: Skin is warm.     Findings: No rash.  Neurological:     Mental Status: He is alert and oriented to person, place, and time.      ED Treatments / Results  Labs (all labs ordered are listed, but only abnormal results are displayed) Labs Reviewed  CBC WITH DIFFERENTIAL/PLATELET - Abnormal; Notable for the following components:      Result Value   WBC 12.5 (*)    Neutro Abs 9.9 (*)    Monocytes Absolute 1.1 (*)    All other components within normal limits  COMPREHENSIVE METABOLIC PANEL - Abnormal; Notable for the following components:   Glucose, Bld 105 (*)    All other components within normal limits  URINALYSIS, ROUTINE W REFLEX MICROSCOPIC - Abnormal; Notable for the following components:   Color, Urine STRAW (*)    Hgb urine dipstick SMALL (*)    Ketones, ur 5 (*)    All other components within normal limits  SARS CORONAVIRUS 2 (HOSPITAL ORDER, PERFORMED IN [facility])  LIPASE, BLOOD    EKG None  Radiology Ct Abdomen Pelvis W Contrast  Result Date: 01/27/2019 CLINICAL DATA:  Abdominal pain EXAM: CT ABDOMEN AND PELVIS WITH CONTRAST TECHNIQUE: Multidetector CT imaging of the abdomen and pelvis was performed using the standard protocol following bolus administration of intravenous contrast. CONTRAST:  100mL OMNIPAQUE IOHEXOL 300 MG/ML  SOLN COMPARISON:  None. FINDINGS: Lower chest: Lung bases are clear. Hepatobiliary: No focal liver lesions are appreciable. The gallbladder wall is not appreciably thickened. There is no biliary duct dilatation. Pancreas: There is no pancreatic mass or inflammatory focus. Spleen: No splenic lesions are evident. Adrenals/Urinary Tract: Adrenals bilaterally appear normal. Kidneys bilaterally show no evident mass or hydronephrosis on either side. There are fetal lobulations bilaterally, an anatomic variant. There is  no evident renal or ureteral calculus on either side. Urinary bladder is midline with wall thickness within normal limits. Stomach/Bowel: There is no appreciable bowel wall or mesenteric thickening. There is no evident bowel obstruction. No free air or portal venous air. The terminal ileum appears unremarkable. Vascular/Lymphatic: No abdominal aortic aneurysm. No vascular lesions evident. No adenopathy is appreciable in the abdomen or pelvis. Reproductive: Prostate is mildly prominent for age with mild generalized increase in attenuation. Seminal vesicles appear unremarkable. No evident pelvic mass. Other: Appendix measures 13 mm proximally. There are several foci of calcification in the appendix. There is mild appendiceal wall thickening and enhancement. There is no periappendiceal region fluid or abscess. No perforation evident. No abscess or ascites evident in the abdomen or pelvis. Musculoskeletal: There are no blastic or lytic bone lesions. There is no intramuscular or abdominal wall lesion. IMPRESSION: 1.  Findings indicative of a degree of acute appendicitis. Appendix: Location: Appendix arises inferiorly from the cecum and  tracks anterior to the psoas muscle at the level of the mid sacrum. Diameter: 13 mm proximally with tapering more distally. Appendicolith: Present proximally and in the mid appendix. Mucosal hyper-enhancement: Slight Extraluminal gas: None Periappendiceal collection: None. Subtle soft tissue stranding adjacent to the proximal appendix. 2. Prostate mildly prominent for age with increased attenuation. No focal prostatic lesion evident. This finding may warrant PSA correlation and clinical assessment. 3.  No bowel obstruction.  No abscess in the abdomen or pelvis. 4. No renal or ureteral calculus. No hydronephrosis. Urinary bladder wall thickness within normal limits. Critical Value/emergent results were called by telephone at the time of interpretation on 01/27/2019 at 1:24 pm to Dr. Virgel Manifold , who verbally acknowledged these results. Electronically Signed   By: Lowella Grip III M.D.   On: 01/27/2019 13:24    Procedures Procedures (including critical care time)  Medications Ordered in ED Medications  ciprofloxacin (CIPRO) IVPB 400 mg (400 mg Intravenous New Bag/Given 01/27/19 1458)    And  metroNIDAZOLE (FLAGYL) IVPB 500 mg (500 mg Intravenous New Bag/Given 01/27/19 1502)  HYDROmorphone (DILAUDID) injection 1 mg (has no administration in time range)  morphine 4 MG/ML injection 4 mg (4 mg Intravenous Given 01/27/19 1043)  sodium chloride 0.9 % bolus 1,000 mL (0 mLs Intravenous Stopped 01/27/19 1455)  fentaNYL (SUBLIMAZE) injection 25 mcg (25 mcg Intravenous Given 01/27/19 1128)  iohexol (OMNIPAQUE) 300 MG/ML solution 100 mL (100 mLs Intravenous Contrast Given 01/27/19 1314)  fentaNYL (SUBLIMAZE) injection 25 mcg (25 mcg Intravenous Given 01/27/19 1357)     Initial Impression / Assessment and Plan / ED Course  I have reviewed the triage vital signs and the nursing notes.  Pertinent labs & imaging results that were available during my care of the patient were reviewed by me and considered in my medical decision making (see chart for details).  Clinical Course as of Jan 26 1513  Sat Jan 27, 2019  1120 Leukocytosis noted at 12.5.  WBC(!): 12.5 [AH]  1335 1. Findings indicative of a degree of acute appendicitis.  Appendix: Location: Appendix arises inferiorly from the cecum and tracks anterior to the psoas muscle at the level of the mid sacrum.  Diameter: 13 mm proximally with tapering more distally.  Appendicolith: Present proximally and in the mid appendix.  Mucosal hyper-enhancement: Slight  Extraluminal gas: None  Periappendiceal collection: None. Subtle soft tissue stranding adjacent to the proximal appendix.  2. Prostate mildly prominent for age with increased attenuation. No focal prostatic lesion evident. This finding may warrant  PSA correlation and clinical assessment.  3. No bowel obstruction. No abscess in the abdomen or pelvis.  4. No renal or ureteral calculus. No hydronephrosis. Urinary bladder wall thickness within normal limits.    CT Abdomen Pelvis W Contrast [AH]    Clinical Course User Index [AH] Arville Lime, PA-C      Patient presents with RLQ abdominal pain. Labs, vitals, and imaging reviewed. IVF and pain medicine provided in the ER. Leukocytosis noted at 12.5. CT reveals acute appendicitis. Consulted general surgery. General surgery states they will evaluate patient. Ordered antibiotics and COVID-19 testing. Dr. Grandville Silos has evaluated patient and will perform laparascopic appendectomy after COVID-19 results return. COVID-19 test is negative. Patient will be admitted for acute appendicitis.   Final Clinical Impressions(s) / ED Diagnoses   Final diagnoses:  Right lower quadrant abdominal pain  Acute appendicitis, unspecified acute appendicitis type    ED Discharge Orders    None  Carlyle BasquesHernandez, Dewayne Severe UplandP, [place]PA-C 01/27/19 1521    Raeford RazorKohut, Stephen, MD 01/28/19 [phone]

## 2019-01-27 NOTE — ED Triage Notes (Signed)
Pt here for evaluation of sharp central abdominal pain that woke him out of sleep at 0600 today. Denies N/V/D. Endorses right sided abdominal tenderness. Last BM this morning.

## 2019-01-28 ENCOUNTER — Encounter (HOSPITAL_COMMUNITY): Payer: Self-pay | Admitting: General Surgery

## 2019-01-28 MED ORDER — TRAMADOL HCL 50 MG PO TABS: 50.0000 mg | ORAL_TABLET | Freq: Four times a day (QID) | ORAL | 0 refills | Status: DC | PRN

## 2019-01-28 NOTE — Discharge Summary (Signed)
Physician Discharge Summary    Patient ID: Jordan Klein MRN: [id number] DOB/AGE: 10-16-1980  38 y.o.  Patient Care Team: Patient, No Pcp Per as PCP - General (General Practice)  Admit date: 01/27/2019  Discharge date: 01/28/2019  Hospital Stay = 0 days    Discharge Diagnoses:  Active Problems:   S/P laparoscopic appendectomy   1 Day Post-Op  01/27/2019  POST-OPERATIVE DIAGNOSIS:   Acute suppurative appendicitis  SURGERY:  01/27/2019  Procedure(s): APPENDECTOMY LAPAROSCOPIC  SURGEON:    Surgeon(s): Violeta Gelinas, MD  Consults: None  Hospital Course:   Patient found to have appendicitis.  The patient underwent the surgery above.  Postoperatively, the patient gradually mobilized and advanced to a solid diet.  Pain and other symptoms were treated aggressively.    By the time of discharge, the patient was walking well the hallways, eating food, having flatus.  Pain was well-controlled on an oral medications.  Does not seem to early and is otherwise healthy, and was felt overnight antibiotics was all he needed and did not need to be discharged with oral antibiotics.  Based on meeting discharge criteria and continuing to recover, I felt it was safe for the patient to be discharged from the hospital to further recover with close followup. Postoperative recommendations were discussed in detail.  They are written as well.  Discharged Condition: good  Discharge Exam: Blood pressure 102/61, pulse 68, temperature 98 F (36.7 C), temperature source Oral, resp. rate 18, height  (1.676 m), weight 63.5 kg, SpO2 98 %.  General: Pt awake/alert/oriented x4 in No acute distress Eyes: PERRL, normal EOM.  Sclera clear.  No icterus Neuro: CN II-XII intact w/o focal sensory/motor deficits. Lymph: No head/neck/groin lymphadenopathy Psych:  No delerium/psychosis/paranoia HENT: Normocephalic, Mucus membranes moist.  No thrush Neck: Supple, No tracheal deviation Chest: No chest  wall pain w good excursion CV:  Pulses intact.  Regular rhythm MS: Normal AROM mjr joints.  No obvious deformity Abdomen: Soft.  Nondistended.  Mildly tender at incisions only.  No evidence of peritonitis.  No incarcerated hernias. Ext:  SCDs BLE.  No mjr edema.  No cyanosis Skin: No petechiae / purpura   Disposition:   Follow-up Information    [facility] Schedule an appointment as soon as possible for a visit in 3 week(s).   Specialty: General Surgery Why: Call office Monday morning to set up.  Follow-up in the "[facility] clinic" Contact information: [address] [place] [address] [phone]          Discharge disposition: 01-Home or Self Care       Discharge Instructions    Call MD for:   Complete by: As directed    FEVER > 101.5 F  (temperatures < 101.5 F are not significant)   Call MD for:  extreme fatigue   Complete by: As directed    Call MD for:  persistant dizziness or light-headedness   Complete by: As directed    Call MD for:  persistant nausea and vomiting   Complete by: As directed    Call MD for:  redness, tenderness, or signs of infection (pain, swelling, redness, odor or green/yellow discharge around incision site)   Complete by: As directed    Call MD for:  severe uncontrolled pain   Complete by: As directed    Diet - low sodium heart healthy   Complete by: As directed    Start with a bland diet such as soups, liquids,  starchy foods, low fat foods, etc. the first few days at home. Gradually advance to a solid, low-fat, high fiber diet by the end of the first week at home.   Add a fiber supplement to your diet (Metamucil, etc) If you feel full, bloated, or constipated, stay on a full liquid or pureed/blenderized diet for a few days until you feel better and are no longer constipated.   Discharge instructions   Complete by: As directed    See Discharge Instructions If you are not getting better  after two weeks or are noticing you are getting worse, contact our office [phone] for further advice.  We may need to adjust your medications, re-evaluate you in the office, send you to the emergency room, or see what other things we can do to help. The clinic staff is available to answer your questions during regular business hours (8:30am-5pm).  Please don't hesitate to call and ask to speak to one of our nurses for clinical concerns.    A surgeon from [facility] is always on call at the hospitals 24 hours/day If you have a medical emergency, go to the nearest emergency room or call 911.   Discharge wound care:   Complete by: As directed    It is good for closed incisions and even open wounds to be washed every day.  Shower every day.  Short baths are fine.  Wash the incisions and wounds clean with soap & water.     You may leave closed incisions open to air if it is dry.   You may cover the incision with clean gauze & replace it after your daily shower for comfort.   You have skin glue (Dermabond) on your incision, leave them in place.  They will fall off on their own like a scab.  You may trim any edges that curl up with clean scissors.   Driving Restrictions   Complete by: As directed    You may drive when: - you are no longer taking narcotic prescription pain medication - you can comfortably wear a seatbelt - you can safely make sudden turns/stops without pain.   Increase activity slowly   Complete by: As directed    Start light daily activities --- self-care, walking, climbing stairs- beginning the day after surgery.  Gradually increase activities as tolerated.  Control your pain to be active.  Stop when you are tired.  Ideally, walk several times a day, eventually an hour a day.   Most people are back to most day-to-day activities in a few weeks.  It takes 4-6 weeks to get back to unrestricted, intense activity. If you can walk 30 minutes without difficulty, it is  safe to try more intense activity such as jogging, treadmill, bicycling, low-impact aerobics, swimming, etc. Save the most intensive and strenuous activity for last (Usually 4-8 weeks after surgery) such as sit-ups, heavy lifting, contact sports, etc.  Refrain from any intense heavy lifting or straining until you are off narcotics for pain control.  You will have off days, but things should improve week-by-week. DO NOT PUSH THROUGH PAIN.  Let pain be your guide: If it hurts to do something, don't do it.   Lifting restrictions   Complete by: As directed    If you can walk 30 minutes without difficulty, it is safe to try more intense activity such as jogging, treadmill, bicycling, low-impact aerobics, swimming, etc. Save the most intensive and strenuous activity for last (Usually 4-8 weeks after  surgery) such as sit-ups, heavy lifting, contact sports, etc.   Refrain from any intense heavy lifting or straining until you are off narcotics for pain control.  You will have off days, but things should improve week-by-week. DO NOT PUSH THROUGH PAIN.  Let pain be your guide: If it hurts to do something, don't do it.  Pain is your body warning you to avoid that activity for another week until the pain goes down.   May shower / Bathe   Complete by: As directed    May walk up steps   Complete by: As directed    Sexual Activity Restrictions   Complete by: As directed    You may have sexual intercourse when it is comfortable. If it hurts to do something, stop.      Allergies as of 01/28/2019      Reactions   Banana Anaphylaxis, Itching, Swelling   Kiwi Extract Anaphylaxis, Hives, Itching   Other Anaphylaxis, Itching, Swelling   Walnuts   Pineapple Anaphylaxis, Itching, Swelling   Penicillins Other (See Comments)   Did it involve swelling of the face/tongue/throat, SOB, or low BP? Unknown Did it involve sudden or severe rash/hives, skin peeling, or any reaction on the inside of your mouth or nose?  Unknown Did you need to seek medical attention at a hospital or doctor's office? Unknown When did it last happen? CHILDHOOD If all above answers are "NO", may proceed with cephalosporin use.      Medication List    TAKE these medications   traMADol 50 MG tablet Commonly known as: ULTRAM Take 1 tablet (50 mg total) by mouth every 6 (six) hours as needed (mild pain).            Discharge Care Instructions  (From admission, onward)         Start     Ordered   01/28/19 0000  Discharge wound care:    Comments: It is good for closed incisions and even open wounds to be washed every day.  Shower every day.  Short baths are fine.  Wash the incisions and wounds clean with soap & water.     You may leave closed incisions open to air if it is dry.   You may cover the incision with clean gauze & replace it after your daily shower for comfort.   You have skin glue (Dermabond) on your incision, leave them in place.  They will fall off on their own like a scab.  You may trim any edges that curl up with clean scissors.   01/28/19 0819          Significant Diagnostic Studies:  Results for orders placed or performed during the hospital encounter of 01/27/19 (from the past 72 hour(s))  CBC with Differential     Status: Abnormal   Collection Time: 01/27/19 10:42 AM  Result Value Ref Range   WBC 12.5 (H) 4.0 - 10.5 K/uL   RBC 5.28 4.22 - 5.81 MIL/uL   Hemoglobin 16.6 13.0 - 17.0 g/dL   HCT 46.4 39.0 - 52.0 %   MCV 87.9 80.0 - 100.0 fL   MCH 31.4 26.0 - 34.0 pg   MCHC 35.8 30.0 - 36.0 g/dL   RDW 11.8 11.5 - 15.5 %   Platelets 192 150 - 400 K/uL   nRBC 0.0 0.0 - 0.2 %   Neutrophils Relative % 79 %   Neutro Abs 9.9 (H) 1.7 - 7.7 K/uL   Lymphocytes Relative 12 %  Lymphs Abs 1.5 0.7 - 4.0 K/uL   Monocytes Relative 9 %   Monocytes Absolute 1.1 (H) 0.1 - 1.0 K/uL   Eosinophils Relative 0 %   Eosinophils Absolute 0.1 0.0 - 0.5 K/uL   Basophils Relative 0 %   Basophils Absolute  0.0 0.0 - 0.1 K/uL   Immature Granulocytes 0 %   Abs Immature Granulocytes 0.02 0.00 - 0.07 K/uL    Comment: Performed at [facility], [address] [place], [place] [address]  Comprehensive metabolic panel     Status: Abnormal   Collection Time: 01/27/19 10:42 AM  Result Value Ref Range   Sodium 138 135 - 145 mmol/L   Potassium 4.4 3.5 - 5.1 mmol/L    Comment: SLIGHT HEMOLYSIS   Chloride 105 98 - 111 mmol/L   CO2 23 22 - 32 mmol/L   Glucose, Bld 105 (H) 70 - 99 mg/dL   BUN 18 6 - 20 mg/dL   Creatinine, Ser 1.911.09 0.61 - 1.24 mg/dL   Calcium 9.3 8.9 - 47.810.3 mg/dL   Total Protein 7.1 6.5 - 8.1 g/dL   Albumin 4.4 3.5 - 5.0 g/dL   AST 29 15 - 41 U/L   ALT 17 0 - 44 U/L   Alkaline Phosphatase 69 38 - 126 U/L   Total Bilirubin 0.7 0.3 - 1.2 mg/dL   GFR calc non Af Amer >60 >60 mL/min   GFR calc Af Amer >60 >60 mL/min   Anion gap 10 5 - 15    Comment: Performed at [facility], [address] [place], [place] [address]  Lipase, blood     Status: None   Collection Time: 01/27/19 10:42 AM  Result Value Ref Range   Lipase 28 11 - 51 U/L    Comment: Performed at [facility], [address] [place], [place] [address]  Urinalysis, Routine w reflex microscopic     Status: Abnormal   Collection Time: 01/27/19  2:00 PM  Result Value Ref Range   Color, Urine STRAW (A) YELLOW   APPearance CLEAR CLEAR   Specific Gravity, Urine 1.015 1.005 - 1.030   pH 6.0 5.0 - 8.0   Glucose, UA NEGATIVE NEGATIVE mg/dL   Hgb urine dipstick SMALL (A) NEGATIVE   Bilirubin Urine NEGATIVE NEGATIVE   Ketones, ur 5 (A) NEGATIVE mg/dL   Protein, ur NEGATIVE NEGATIVE mg/dL   Nitrite NEGATIVE NEGATIVE   Leukocytes,Ua NEGATIVE NEGATIVE   RBC / HPF 0-5 0 - 5 RBC/hpf   Bacteria, UA NONE SEEN NONE SEEN   Mucus PRESENT     Comment: Performed at [facility], [address] [place], [place] [address]  SARS Coronavirus 2 (CEPHEID - Performed in [facility] hospital lab), Hosp Order      Status: None   Collection Time: 01/27/19  2:00 PM   Specimen: Nasopharyngeal Swab  Result Value Ref Range   SARS Coronavirus 2 NEGATIVE NEGATIVE    Comment: (NOTE) If result is NEGATIVE SARS-CoV-2 target nucleic acids are NOT DETECTED. The SARS-CoV-2 RNA is generally detectable in upper and lower  respiratory specimens during the acute phase of infection. The lowest  concentration of SARS-CoV-2 viral copies this assay can detect is 250  copies / mL. A negative result does not preclude SARS-CoV-2 infection  and should not be used as the sole basis for treatment or other  patient management decisions.  A negative result may occur with  improper specimen collection / handling,  submission of specimen other  than nasopharyngeal swab, presence of viral mutation(s) within the  areas targeted by this assay, and inadequate number of viral copies  (<250 copies / mL). A negative result must be combined with clinical  observations, patient history, and epidemiological information. If result is POSITIVE SARS-CoV-2 target nucleic acids are DETECTED. The SARS-CoV-2 RNA is generally detectable in upper and lower  respiratory specimens dur ing the acute phase of infection.  Positive  results are indicative of active infection with SARS-CoV-2.  Clinical  correlation with patient history and other diagnostic information is  necessary to determine patient infection status.  Positive results do  not rule out bacterial infection or co-infection with other viruses. If result is PRESUMPTIVE POSTIVE SARS-CoV-2 nucleic acids MAY BE PRESENT.   A presumptive positive result was obtained on the submitted specimen  and confirmed on repeat testing.  While 2019 novel coronavirus  (SARS-CoV-2) nucleic acids may be present in the submitted sample  additional confirmatory testing may be necessary for epidemiological  and / or clinical management purposes  to differentiate between  SARS-CoV-2 and other Sarbecovirus  currently known to infect humans.  If clinically indicated additional testing with an alternate test  methodology [phone]) is advised. The SARS-CoV-2 RNA is generally  detectable in upper and lower respiratory sp ecimens during the acute  phase of infection. The expected result is Negative. Fact Sheet for Patients:  BoilerBrush.com.cy Fact Sheet for Healthcare Providers: https://pope.com/ This test is not yet approved or cleared by the [place] FDA and has been authorized for detection and/or diagnosis of SARS-CoV-2 by FDA under an Emergency Use Authorization (EUA).  This EUA will remain in effect (meaning this test can be used) for the duration of the COVID-19 declaration under Section 564(b)(1) of the Act, 21 U.S.C. section 360bbb-3(b)(1), unless the authorization is terminated or revoked sooner. Performed at [facility], [address], [place], [place] [address]     Ct Abdomen Pelvis W Contrast  Result Date: 01/27/2019 CLINICAL DATA:  Abdominal pain EXAM: CT ABDOMEN AND PELVIS WITH CONTRAST TECHNIQUE: Multidetector CT imaging of the abdomen and pelvis was performed using the standard protocol following bolus administration of intravenous contrast. CONTRAST:  OMNIPAQUE IOHEXOL 300 MG/ML  SOLN COMPARISON:  None. FINDINGS: Lower chest: Lung bases are clear. Hepatobiliary: No focal liver lesions are appreciable. The gallbladder wall is not appreciably thickened. There is no biliary duct dilatation. Pancreas: There is no pancreatic mass or inflammatory focus. Spleen: No splenic lesions are evident. Adrenals/Urinary Tract: Adrenals bilaterally appear normal. Kidneys bilaterally show no evident mass or hydronephrosis on either side. There are fetal lobulations bilaterally, an anatomic variant. There is no evident renal or ureteral calculus on either side. Urinary bladder is midline with wall thickness within normal limits.  Stomach/Bowel: There is no appreciable bowel wall or mesenteric thickening. There is no evident bowel obstruction. No free air or portal venous air. The terminal ileum appears unremarkable. Vascular/Lymphatic: No abdominal aortic aneurysm. No vascular lesions evident. No adenopathy is appreciable in the abdomen or pelvis. Reproductive: Prostate is mildly prominent for age with mild generalized increase in attenuation. Seminal vesicles appear unremarkable. No evident pelvic mass. Other: Appendix measures 13 mm proximally. There are several foci of calcification in the appendix. There is mild appendiceal wall thickening and enhancement. There is no periappendiceal region fluid or abscess. No perforation evident. No abscess or ascites evident in the abdomen or pelvis. Musculoskeletal: There are no blastic or lytic bone lesions. There  is no intramuscular or abdominal wall lesion. IMPRESSION: 1.  Findings indicative of a degree of acute appendicitis. Appendix: Location: Appendix arises inferiorly from the cecum and tracks anterior to the psoas muscle at the level of the mid sacrum. Diameter: 13 mm proximally with tapering more distally. Appendicolith: Present proximally and in the mid appendix. Mucosal hyper-enhancement: Slight Extraluminal gas: None Periappendiceal collection: None. Subtle soft tissue stranding adjacent to the proximal appendix. 2. Prostate mildly prominent for age with increased attenuation. No focal prostatic lesion evident. This finding may warrant PSA correlation and clinical assessment. 3.  No bowel obstruction.  No abscess in the abdomen or pelvis. 4. No renal or ureteral calculus. No hydronephrosis. Urinary bladder wall thickness within normal limits. Critical Value/emergent results were called by telephone at the time of interpretation on 01/27/2019 at 1:24 pm to Dr. Raeford RazorStephen Kohut , who verbally acknowledged these results. Electronically Signed   By: Bretta BangWilliam  Woodruff III M.D.   On: 01/27/2019  13:24    History reviewed. No pertinent past medical history.  Past Surgical History:  Procedure Laterality Date  . LAPAROSCOPIC APPENDECTOMY N/A 01/27/2019   Procedure: APPENDECTOMY LAPAROSCOPIC;  Surgeon: Violeta Gelinashompson, Burke, MD;  [facility] OR;  Service: General;  Laterality: N/A;  . WISDOM TOOTH EXTRACTION      Social History   Socioeconomic History  . Marital status: Married    Spouse name: Not on file  . Number of children: Not on file  . Years of education: Not on file  . Highest education level: Not on file  Occupational History  . Not on file  Social Needs  . Financial resource strain: Not on file  . Food insecurity    Worry: Not on file    Inability: Not on file  . Transportation needs    Medical: Not on file    Non-medical: Not on file  Tobacco Use  . Smoking status: Never Smoker  . Smokeless tobacco: Never Used  Substance and Sexual Activity  . Alcohol use: Yes    Alcohol/week: 1.0 standard drinks    Types: 1 Cans of beer per week  . Drug use: Never  . Sexual activity: Not on file  Lifestyle  . Physical activity    Days per week: Not on file    Minutes per session: Not on file  . Stress: Not on file  Relationships  . Social Musicianconnections    Talks on phone: Not on file    Gets together: Not on file    Attends religious service: Not on file    Active member of club or organization: Not on file    Attends meetings of clubs or organizations: Not on file    Relationship status: Not on file  . Intimate partner violence    Fear of current or ex partner: Not on file    Emotionally abused: Not on file    Physically abused: Not on file    Forced sexual activity: Not on file  Other Topics Concern  . Not on file  Social History Narrative  . Not on file    History reviewed. No pertinent family history.  Current Facility-Administered Medications  Medication Dose Route Frequency Provider Last Rate Last Dose  . celecoxib (CELEBREX) capsule 200 mg  200 mg Oral  BID Violeta Gelinashompson, Burke, MD   200 mg at 01/27/19 2147  . dextrose 5 % and 0.45 % NaCl with KCl 20 mEq/L infusion   Intravenous Continuous Violeta Gelinashompson, Burke, MD   Stopped at 01/28/19  1610  . diphenhydrAMINE (BENADRYL) capsule 25 mg  25 mg Oral Q6H PRN Violeta Gelinas, MD       Or  . diphenhydrAMINE (BENADRYL) injection 25 mg  25 mg Intravenous Q6H PRN Violeta Gelinas, MD      . enoxaparin (LOVENOX) injection 40 mg  40 mg Subcutaneous Q24H Violeta Gelinas, MD      . gabapentin (NEURONTIN) capsule 300 mg  300 mg Oral BID Violeta Gelinas, MD   300 mg at 01/27/19 2147  . methocarbamol (ROBAXIN) tablet 500 mg  500 mg Oral Q6H PRN Violeta Gelinas, MD      . morphine 4 MG/ML injection 4 mg  4 mg Intravenous Q1H PRN Violeta Gelinas, MD      . ondansetron (ZOFRAN-ODT) disintegrating tablet 4 mg  4 mg Oral Q6H PRN Violeta Gelinas, MD       Or  . ondansetron [facility]) injection 4 mg  4 mg Intravenous Q6H PRN Violeta Gelinas, MD      . oxyCODONE (Oxy IR/ROXICODONE) immediate release tablet 5-10 mg  5-10 mg Oral Q4H PRN Violeta Gelinas, MD   10 mg at 01/28/19 9604  . traMADol (ULTRAM) tablet 50 mg  50 mg Oral Q6H PRN Violeta Gelinas, MD      . zolpidem (AMBIEN) tablet 5 mg  5 mg Oral QHS PRN,MR X 1 Violeta Gelinas, MD         Allergies  Allergen Reactions  . Banana Anaphylaxis, Itching and Swelling  . Kiwi Extract Anaphylaxis, Hives and Itching  . Other Anaphylaxis, Itching and Swelling    Walnuts  . Pineapple Anaphylaxis, Itching and Swelling  . Penicillins Other (See Comments)    Did it involve swelling of the face/tongue/throat, SOB, or low BP? Unknown Did it involve sudden or severe rash/hives, skin peeling, or any reaction on the inside of your mouth or nose? Unknown Did you need to seek medical attention at a hospital or doctor's office? Unknown When did it last happen? CHILDHOOD If all above answers are "NO", may proceed with cephalosporin use.     Signed: Lorenso Courier,  MD, FACS, MASCRS Gastrointestinal and Minimally Invasive Surgery    [address], [address] [place], [place] [address] [phone] Main / Paging [phone] Fax   01/28/2019, 8:21 AM

## 2019-01-28 NOTE — Discharge Instructions (Signed)
SURGERY: POST OP INSTRUCTIONS (Surgery for small bowel obstruction, colon resection, etc)   ######################################################################  EAT Gradually transition to a high fiber diet with a fiber supplement over the next few days after discharge  WALK Walk an hour a day.  Control your pain to do that.    CONTROL PAIN Control pain so that you can walk, sleep, tolerate sneezing/coughing, go up/down stairs.  HAVE A BOWEL MOVEMENT DAILY Keep your bowels regular to avoid problems.  OK to try a laxative to override constipation.  OK to use an antidairrheal to slow down diarrhea.  Call if not better after 2 tries  CALL IF YOU HAVE PROBLEMS/CONCERNS Call if you are still struggling despite following these instructions. Call if you have concerns not answered by these instructions  ######################################################################   DIET Follow a light diet the first few days at home.  Start with a bland diet such as soups, liquids, starchy foods, low fat foods, etc.  If you feel full, bloated, or constipated, stay on a ful liquid or pureed/blenderized diet for a few days until you feel better and no longer constipated. Be sure to drink plenty of fluids every day to avoid getting dehydrated (feeling dizzy, not urinating, etc.). Gradually add a fiber supplement to your diet over the next week.  Gradually get back to a regular solid diet.  Avoid fast food or heavy meals the first week as you are more likely to get nauseated. It is expected for your digestive tract to need a few months to get back to normal.  It is common for your bowel movements and stools to be irregular.  You will have occasional bloating and cramping that should eventually fade away.  Until you are eating solid food normally, off all pain medications, and back to regular activities; your bowels will not be normal. Focus on eating a low-fat, high fiber diet the rest of your life  (See Getting to Good Bowel Health, below).  CARE of your INCISION or WOUND It is good for closed incision and even open wounds to be washed every day.  Shower every day.  Short baths are fine.  Wash the incisions and wounds clean with soap & water.    If you have a closed incision(s), wash the incision with soap & water every day.  You may leave closed incisions open to air if it is dry.   You may cover the incision with clean gauze & replace it after your daily shower for comfort.  You have skin glue (Dermabond) on your incision, leave them in place.  They will fall off on their own like a scab.  You may trim any edges that curl up with clean scissors.  Closed incisions can have mild bleeding or drainage the first few days until the skin edges scab over & seal.   If you have an open wound with a wound vac, see wound vac care instructions.     ACTIVITIES as tolerated Start light daily activities --- self-care, walking, climbing stairs-- beginning the day after surgery.  Gradually increase activities as tolerated.  Control your pain to be active.  Stop when you are tired.  Ideally, walk several times a day, eventually an hour a day.   Most people are back to most day-to-day activities in a few weeks.  It takes 4-8 weeks to get back to unrestricted, intense activity. If you can walk 30 minutes without difficulty, it is safe to try more intense activity such as jogging, treadmill,  bicycling, low-impact aerobics, swimming, etc. Save the most intensive and strenuous activity for last (Usually 4-8 weeks after surgery) such as sit-ups, heavy lifting, contact sports, etc.  Refrain from any intense heavy lifting or straining until you are off narcotics for pain control.  You will have off days, but things should improve week-by-week. DO NOT PUSH THROUGH PAIN.  Let pain be your guide: If it hurts to do something, don't do it.  Pain is your body warning you to avoid that activity for another week until the  pain goes down. You may drive when you are no longer taking narcotic prescription pain medication, you can comfortably wear a seatbelt, and you can safely make sudden turns/stops to protect yourself without hesitating due to pain. You may have sexual intercourse when it is comfortable. If it hurts to do something, stop.  MEDICATIONS Take your usually prescribed home medications unless otherwise directed.   Blood thinners:  Usually you can restart any strong blood thinners after the second postoperative day.  It is OK to take aspirin right away.     If you are on strong blood thinners (warfarin/Coumadin, Plavix, Xerelto, Eliquis, Pradaxa, etc), discuss with your surgeon, medicine PCP, and/or cardiologist for instructions on when to restart the blood thinner & if blood monitoring is needed (PT/INR blood check, etc).     PAIN CONTROL Pain after surgery or related to activity is often due to strain/injury to muscle, tendon, nerves and/or incisions.  This pain is usually short-term and will improve in a few months.  To help speed the process of healing and to get back to regular activity more quickly, DO THE FOLLOWING THINGS TOGETHER: 1. Increase activity gradually.  DO NOT PUSH THROUGH PAIN 2. Use Ice and/or Heat 3. Try Gentle Massage and/or Stretching 4. Take over the counter pain medication 5. Take Narcotic prescription pain medication for more severe pain  Good pain control = faster recovery.  It is better to take more medicine to be more active than to stay in bed all day to avoid medications. 1.  Increase activity gradually Avoid heavy lifting at first, then increase to lifting as tolerated over the next 6 weeks. Do not push through the pain.  Listen to your body and avoid positions and maneuvers than reproduce the pain.  Wait a few days before trying something more intense Walking an hour a day is encouraged to help your body recover faster and more safely.  Start slowly and stop when  getting sore.  If you can walk 30 minutes without stopping or pain, you can try more intense activity (running, jogging, aerobics, cycling, swimming, treadmill, sex, sports, weightlifting, etc.) Remember: If it hurts to do it, then dont do it! 2. Use Ice and/or Heat You will have swelling and bruising around the incisions.  This will take several weeks to resolve. Ice packs or heating pads (6-8 times a day, 30-60 minutes at a time) will help sooth soreness & bruising. Some people prefer to use ice alone, heat alone, or alternate between ice & heat.  Experiment and see what works best for you.  Consider trying ice for the first few days to help decrease swelling and bruising; then, switch to heat to help relax sore spots and speed recovery. Shower every day.  Short baths are fine.  It feels good!  Keep the incisions and wounds clean with soap & water.   3. Try Gentle Massage and/or Stretching Massage at the area of pain many times a  day Stop if you feel pain - do not overdo it 4. Take over the counter pain medication This helps the muscle and nerve tissues become less irritable and calm down faster Choose ONE of the following over-the-counter anti-inflammatory medications: Acetaminophen 500mg  tabs (Tylenol) 1-2 pills with every meal and just before bedtime (avoid if you have liver problems or if you have acetaminophen in you narcotic prescription) Naproxen 220mg  tabs (ex. Aleve, Naprosyn) 1-2 pills twice a day (avoid if you have kidney, stomach, IBD, or bleeding problems) Ibuprofen 200mg  tabs (ex. Advil, Motrin) 3-4 pills with every meal and just before bedtime (avoid if you have kidney, stomach, IBD, or bleeding problems) Take with food/snack several times a day as directed for at least 2 weeks to help keep pain / soreness down & more manageable. 5. Take Narcotic prescription pain medication for more severe pain A prescription for strong pain control is often given to you upon discharge (for  example: oxycodone/Percocet, hydrocodone/Norco/Vicodin, or tramadol/Ultram) Take your pain medication as prescribed. Be mindful that most narcotic prescriptions contain Tylenol (acetaminophen) as well - avoid taking too much Tylenol. If you are having problems/concerns with the prescription medicine (does not control pain, nausea, vomiting, rash, itching, etc.), please call us [phone] to see if we need to switch you to a different pain medicine that will work better for you and/or control your side effects better. If you need a refill on your pain medication, you must call the office before 4 pm and on weekdays only.  By federal law, prescriptions for narcotics cannot be called into a pharmacy.  They must be filled out on paper & picked up from our office by the patient or authorized caretaker.  Prescriptions cannot be filled after 4 pm nor on weekends.    WHEN TO CALL US [phone] Severe uncontrolled or worsening pain  Fever over 101 F (38.5 C) Concerns with the incision: Worsening pain, redness, rash/hives, swelling, bleeding, or drainage Reactions / problems with new medications (itching, rash, hives, nausea, etc.) Nausea and/or vomiting Difficulty urinating Difficulty breathing Worsening fatigue, dizziness, lightheadedness, blurred vision Other concerns If you are not getting better after two weeks or are noticing you are getting worse, contact our office [phone] for further advice.  We may need to adjust your medications, re-evaluate you in the office, send you to the emergency room, or see what other things we can do to help. The clinic staff is available to answer your questions during regular business hours (8:30am-5pm).  Please dont hesitate to call and ask to speak to one of our nurses for clinical concerns.    A surgeon from [facility] is always on call at the hospitals 24 hours/day If you have a medical emergency, go to the nearest emergency room or  call 911.  FOLLOW UP in our office One the day of your discharge from the hospital (or the next business weekday), please call [facility] to set up or confirm an appointment to see your surgeon in the office for a follow-up appointment.  Usually it is 2-3 weeks after your surgery.   If you have skin staples at your incision(s), let the office know so we can set up a time in the office for the nurse to remove them (usually around 10 days after surgery). Make sure that you call for appointments the day of discharge (or the next business weekday) from the hospital to ensure a convenient appointment time. IF YOU HAVE DISABILITY OR  FAMILY LEAVE FORMS, BRING THEM TO THE OFFICE FOR PROCESSING.  DO NOT GIVE THEM TO YOUR DOCTOR.  [facility] [address], [address], [place], [place]  [address] ? [phone] - Main [phone] - Toll Free,  [phone] - Fax [email or web address]  GETTING TO GOOD BOWEL HEALTH. It is expected for your digestive tract to need a few months to get back to normal.  It is common for your bowel movements and stools to be irregular.  You will have occasional bloating and cramping that should eventually fade away.  Until you are eating solid food normally, off all pain medications, and back to regular activities; your bowels will not be normal.   Avoiding constipation The goal: ONE SOFT BOWEL MOVEMENT A DAY!    Drink plenty of fluids.  Choose water first. TAKE A FIBER SUPPLEMENT EVERY DAY THE REST OF YOUR LIFE During your first week back home, gradually add back a fiber supplement every day Experiment which form you can tolerate.   There are many forms such as powders, tablets, wafers, gummies, etc Psyllium bran (Metamucil), methylcellulose (Citrucel), Miralax or Glycolax, Benefiber, Flax Seed.  Adjust the dose week-by-week (1/2 dose/day to 6 doses a day) until you are moving your bowels 1-2 times a day.  Cut back the dose  or try a different fiber product if it is giving you problems such as diarrhea or bloating. Sometimes a laxative is needed to help jump-start bowels if constipated until the fiber supplement can help regulate your bowels.  If you are tolerating eating & you are farting, it is okay to try a gentle laxative such as double dose MiraLax, prune juice, or Milk of Magnesia.  Avoid using laxatives too often. Stool softeners can sometimes help counteract the constipating effects of narcotic pain medicines.  It can also cause diarrhea, so avoid using for too long. If you are still constipated despite taking fiber daily, eating solids, and a few doses of laxatives, call our office. Controlling diarrhea Try drinking liquids and eating bland foods for a few days to avoid stressing your intestines further. Avoid dairy products (especially milk & ice cream) for a short time.  The intestines often can lose the ability to digest lactose when stressed. Avoid foods that cause gassiness or bloating.  Typical foods include beans and other legumes, cabbage, broccoli, and dairy foods.  Avoid greasy, spicy, fast foods.  Every person has some sensitivity to other foods, so listen to your body and avoid those foods that trigger problems for you. Probiotics (such as active yogurt, Align, etc) may help repopulate the intestines and colon with normal bacteria and calm down a sensitive digestive tract Adding a fiber supplement gradually can help thicken stools by absorbing excess fluid and retrain the intestines to act more normally.  Slowly increase the dose over a few weeks.  Too much fiber too soon can backfire and cause cramping & bloating. It is okay to try and slow down diarrhea with a few doses of antidiarrheal medicines.   Bismuth subsalicylate (ex. Kayopectate, Pepto Bismol) for a few doses can help control diarrhea.  Avoid if pregnant.   Loperamide (Imodium) can slow down diarrhea.  Start with one tablet ( ) first.  Avoid  if you are having fevers or severe pain.  ILEOSTOMY PATIENTS WILL HAVE CHRONIC DIARRHEA since their colon is not in use.    Drink plenty of liquids.  You will need to drink even more glasses of water/liquid a day to  avoid getting dehydrated. Record output from your ileostomy.  Expect to empty the bag every 3-4 hours at first.  Most people with a permanent ileostomy empty their bag 4-6 times at the least.   Use antidiarrheal medicine (especially Imodium) several times a day to avoid getting dehydrated.  Start with a dose at bedtime & breakfast.  Adjust up or down as needed.  Increase antidiarrheal medications as directed to avoid emptying the bag more than 8 times a day (every 3 hours). Work with your wound ostomy nurse to learn care for your ostomy.  See ostomy care instructions. TROUBLESHOOTING IRREGULAR BOWELS 1) Start with a soft & bland diet. No spicy, greasy, or fried foods.  2) Avoid gluten/wheat or dairy products from diet to see if symptoms improve. 3) Miralax 17gm or flax seed mixed in 8oz. water or juice-daily. May use 2-4 times a day as needed. 4) Gas-X, Phazyme, etc. as needed for gas & bloating.  5) Prilosec (omeprazole) over-the-counter as needed 6)  Consider probiotics (Align, Activa, etc) to help calm the bowels down  Call your doctor if you are getting worse or not getting better.  Sometimes further testing (cultures, endoscopy, X-ray studies, CT scans, bloodwork, etc.) may be needed to help diagnose and treat the cause of the diarrhea. [facility] [address], [address], [place], [place]  [id number] [phone] - Main.    [phone]  - Toll Free.   [phone]) [phone] - Fax [email or web address]     Appendicitis, Adult  Appendicitis is inflammation of the appendix. The appendix is a finger-shaped tube that is attached to the large intestine. If appendicitis is not treated, it can cause the appendix to tear (rupture). A ruptured appendix  can lead to a life-threatening infection. It can also cause a painful collection of pus (abscess) to form in the appendix. What are the causes? This condition may be caused by a blockage in the appendix that leads to infection. The blockage can be caused by:  A ball of stool (feces).  Enlarged lymph glands. In some cases, the cause may not be known. What increases the risk? Age is a risk factor. You are more likely to develop this condition if you are between 1610 and 37 years of age. What are the signs or symptoms? Symptoms of this condition include:  Pain that starts around the belly button and moves toward the lower right part of the abdomen. The pain can become more severe as time passes. It gets worse with coughing or sudden movements.  Tenderness in the lower right abdomen.  Nausea.  Vomiting.  Loss of appetite.  Fever.  Difficulty passing stool (constipation).  Passing very loose stools (diarrhea).  Generally feeling unwell. How is this diagnosed? This condition may be diagnosed with:  A physical exam.  Blood tests.  Urine test. To confirm the diagnosis, an ultrasound, MRI, or CT scan may be done. How is this treated? This condition is usually treated with surgery to remove the appendix (appendectomy). There are two methods for doing an appendectomy:  Open appendectomy. In this surgery, the appendix is removed through a large incision that is made in the lower right abdomen. This procedure may be recommended if: ? You have major scarring from a previous surgery. ? You have a bleeding disorder. ? You are pregnant and are about to give birth. ? You have a condition that makes it hard to do surgery through small incisions (laparoscopic procedure). This includes severe infection or a  ruptured appendix.  Laparoscopic appendectomy. In this surgery, the appendix is removed through small incisions. This procedure usually causes less pain and fewer problems than an open  appendectomy. It also has a shorter recovery time. If the appendix has ruptured and an abscess has formed:  A drain may be placed into the abscess to remove fluid.  Antibiotic medicines may be given through an IV.  The appendix may or may not need to be removed. Follow these instructions at home: If you had surgery, follow instructions from your health care provider about how to care for yourself at home and how to care for your incision. Medicines  Take over-the-counter and prescription medicines only as told by your health care provider.  If you were prescribed an antibiotic medicine, take it as told by your health care provider. Do not stop taking the antibiotic even if you start to feel better. Eating and drinking  Follow instructions from your health care provider about eating restrictions. You may slowly resume a regular diet once your nausea or vomiting stops. General instructions  Do not use any products that contain nicotine or tobacco, such as cigarettes, e-cigarettes, and chewing tobacco. If you need help quitting, ask your health care provider.  Do not drive or use heavy machinery while taking prescription pain medicine.  Ask your health care provider if the medicine prescribed to you can cause constipation. You may need to take steps to prevent or treat constipation, such as: ? Drink enough fluid to keep your urine pale yellow. ? Take over-the-counter or prescription medicines. ? Eat foods that are high in fiber, such as beans, whole grains, and fresh fruits and vegetables. ? Limit foods that are high in fat and processed sugars, such as fried or sweet foods.  Keep all follow-up visits as told by your health care provider. This is important. Contact a health care provider if:  There is pus, blood, or excessive drainage coming from your incision.  You have nausea or vomiting. Get help right away if you have:  Worsening abdominal pain.  A  fever.  Chills.  Fatigue.  Muscle aches.  Shortness of breath. Summary  Appendicitis is inflammation of the appendix.  This condition may be caused by a blockage in the appendix that leads to infection.  This condition is usually treated with surgery to remove the appendix. This information is not intended to replace advice given to you by your health care provider. Make sure you discuss any questions you have with your health care provider. Document Released: 07/05/2005 Document Revised: 12/21/2017 Document Reviewed: 12/21/2017 Elsevier Patient Education  2020 [employer].

## 2019-01-28 NOTE — Progress Notes (Signed)
Pt discharged home in stable condition 

## 2019-10-16 ENCOUNTER — Encounter: Payer: Self-pay | Admitting: Neurology

## 2019-10-16 ENCOUNTER — Ambulatory Visit (INDEPENDENT_AMBULATORY_CARE_PROVIDER_SITE_OTHER): Payer: Managed Care, Other (non HMO) | Admitting: Neurology

## 2019-10-16 ENCOUNTER — Other Ambulatory Visit: Payer: Self-pay

## 2019-10-16 VITALS — BP 108/72 | HR 64 | Temp 97.9°F | Ht 66.0 in | Wt 133.0 lb

## 2019-10-16 DIAGNOSIS — R251 Tremor, unspecified: Secondary | ICD-10-CM | POA: Insufficient documentation

## 2019-10-16 NOTE — Progress Notes (Signed)
PATIENT: Jordan Klein DOB: 17-Aug-1980  Chief Complaint  Patient presents with  . Tongue Fasciculation    New room, alone. He is here for persistent, involuntary movements of his tongue. This started around a month and a half ago. This is new. Happens usually when he sticks his tongue out. Otherwise, unnoticeable  . PCP    Cari Caraway, MD     HISTORICAL  Jordan Klein is a 39 year old male, seen in request by his primary care physician Dr. Cari Caraway for evaluation of tongue tremor, initial evaluation was on October 16, 2019.  I have reviewed and summarized the referring note from the referring physician.  He was seen recently by his primary care physician for complaints of difficulty focusing, memory changes, lack of motivation, and lack of energy, patient stated that this has been an ongoing problem for many years, was treated with antidepression for about a month in February without significant improvement, he has stopped taking the medications, he works as a Programme researcher, broadcasting/film/video.  He denies difficulty handling his job,  Since early February 2021, he noted his tongue tremor, he first noticed the symptoms when he he stick out his tongue trying to brushing his tongue, when he look at his himself at the mirror, he noticed the tongue tremor, this was also noted by his wife, he did not feel like, only noticed that if he look at himself in the mirror.  He denied difficulty talking, swallowing, he did not notice limb muscle weakness, he did not notice muscle atrophy or fasciculations anywhere else.   REVIEW OF SYSTEMS: Full 14 system review of systems performed and notable only for as above All other review of systems were negative.  ALLERGIES: Allergies  Allergen Reactions  . Banana Anaphylaxis, Itching and Swelling  . Kiwi Extract Anaphylaxis, Hives and Itching  . Other Anaphylaxis, Itching and Swelling    Walnuts  . Pineapple Anaphylaxis, Itching and Swelling  . Penicillins  Other (See Comments)    Did it involve swelling of the face/tongue/throat, SOB, or low BP? Unknown Did it involve sudden or severe rash/hives, skin peeling, or any reaction on the inside of your mouth or nose? Unknown Did you need to seek medical attention at a hospital or doctor's office? Unknown When did it last happen? CHILDHOOD If all above answers are "NO", may proceed with cephalosporin use.     HOME MEDICATIONS: No current outpatient medications on file.   No current facility-administered medications for this visit.    PAST MEDICAL HISTORY: Past Medical History:  Diagnosis Date  . ADHD   . Fasciculation of tongue     PAST SURGICAL HISTORY: Past Surgical History:  Procedure Laterality Date  . LAPAROSCOPIC APPENDECTOMY N/A 01/27/2019   Procedure: APPENDECTOMY LAPAROSCOPIC;  Surgeon: Georganna Skeans, MD;  Location: [facility];  Service: General;  Laterality: N/A;  . WISDOM TOOTH EXTRACTION      FAMILY HISTORY: No family history on file.  SOCIAL HISTORY: Social History   Socioeconomic History  . Marital status: Married    Spouse name: Jordan Klein   . Number of children: 2  . Years of education: College  . Highest education level: Not on file  Occupational History  . Not on file  Tobacco Use  . Smoking status: Never Smoker  . Smokeless tobacco: Never Used  Substance and Sexual Activity  . Alcohol use: Yes    Comment: occasional  . Drug use: Never  . Sexual activity: Not on file  Other Topics Concern  . Not on file  Social History Narrative   Left handed   Lives with wife   Caffeine use: About 2 cups coffee per day   Social Determinants of Health   Financial Resource Strain:   . Difficulty of Paying Living Expenses:   Food Insecurity:   . Worried About Programme researcher, broadcasting/film/video in the Last Year:   . Barista in the Last Year:   Transportation Needs:   . Freight forwarder (Medical):   Marland Kitchen Lack of Transportation (Non-Medical):   Physical  Activity:   . Days of Exercise per Week:   . Minutes of Exercise per Session:   Stress:   . Feeling of Stress :   Social Connections:   . Frequency of Communication with Friends and Family:   . Frequency of Social Gatherings with Friends and Family:   . Attends Religious Services:   . Active Member of Clubs or Organizations:   . Attends Banker Meetings:   Marland Kitchen Marital Status:   Intimate Partner Violence:   . Fear of Current or Ex-Partner:   . Emotionally Abused:   Marland Kitchen Physically Abused:   . Sexually Abused:      PHYSICAL EXAM   Vitals:   10/16/19 1114  BP: 108/72  Pulse: 64  Temp: 97.9 F (36.6 C)  Weight: 133 lb (60.3 kg)  Height: 5\' 6"  (1.676 m)    Not recorded      Body mass index is 21.47 kg/m.  PHYSICAL EXAMNIATION:  Gen: NAD, conversant, well nourised, well groomed                     Cardiovascular: Regular rate rhythm, no peripheral edema, warm, nontender. Eyes: Conjunctivae clear without exudates or hemorrhage Neck: Supple, no carotid bruits. Pulmonary: Clear to auscultation bilaterally   NEUROLOGICAL EXAM:  MENTAL STATUS: Speech:    Speech is normal; fluent and spontaneous with normal comprehension.  Cognition:     Orientation to time, place and person     Normal recent and remote memory     Normal Attention span and concentration     Normal Language, naming, repeating,spontaneous speech     Fund of knowledge   CRANIAL NERVES: CN II: Visual fields are full to confrontation. Pupils are round equal and briskly reactive to light. CN III, IV, VI: extraocular movement are normal. No ptosis. CN V: Facial sensation is intact to light touch CN VII: Face is symmetric with normal eye closure  CN VIII: Hearing is normal to causal conversation. CN IX, X: Phonation is normal. CN XI: Head turning and shoulder shrug are intact CN XII: There was no tongue atrophy, or fasciculations, tongue strength is normal, there was mild tongue and upper lip  tremor when he protrude his tongue, which disappeared when he put slight force biting on his tongue and relaxed  MOTOR: There is no pronator drift of out-stretched arms. Muscle bulk and tone are normal. Muscle strength is normal.  REFLEXES: Reflexes are 2+ and symmetric at the biceps, triceps, knees, and ankles. Plantar responses are flexor.  SENSORY: Intact to light touch, pinprick and vibratory sensation are intact in fingers and toes.  COORDINATION: There is no trunk or limb dysmetria noted.  GAIT/STANCE: Posture is normal. Gait is steady with normal steps, base, arm swing, and turning. Heel and toe walking are normal. Tandem gait is normal.  Romberg is absent.   DIAGNOSTIC DATA (LABS, IMAGING, TESTING) -  I reviewed patient records, labs, notes, testing and imaging myself where available.   ASSESSMENT AND PLAN  Jordan Klein is a 39 y.o. male   Tremor of tongue and upper lip,  There was no evidence of tongue fasciculation, weakness,  No evidence of limb muscle weakness, fasciculations,  Benign findings, related to posturing of the muscle.  Continue follow-up with his primary care physician, only return to clinic for new issues.   Levert Feinstein, M.D. Ph.D.  [facility] [address], [address] [place], [place] [address] Ph: [phone] Fax: [phone]  CC: Gweneth Dimitri, MD

## 2020-04-12 IMAGING — CT CT ABDOMEN AND PELVIS WITH CONTRAST
2 of 4 series · 15 of 46 positions shown, 17 images · IV contrast (Omni 300)
Comparison: None.

CLINICAL DATA: Abdominal pain

EXAM:
CT ABDOMEN AND PELVIS WITH CONTRAST
TECHNIQUE: Multidetector CT imaging of the abdomen and pelvis was performed
using the standard protocol following bolus administration of
intravenous contrast.
CONTRAST:  100mL OMNIPAQUE IOHEXOL 300 MG/ML  SOLN

[Series 3: a/p w/ 5mm · axial · 0.66mm/px · z∈[+917,+1292]mm · 12 of 86 slices shown, 14 images]
[im 7/86  soft-tissue]
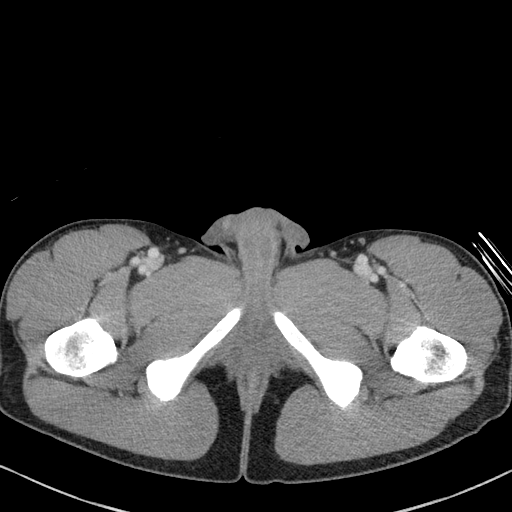
[im 7/86  bone]
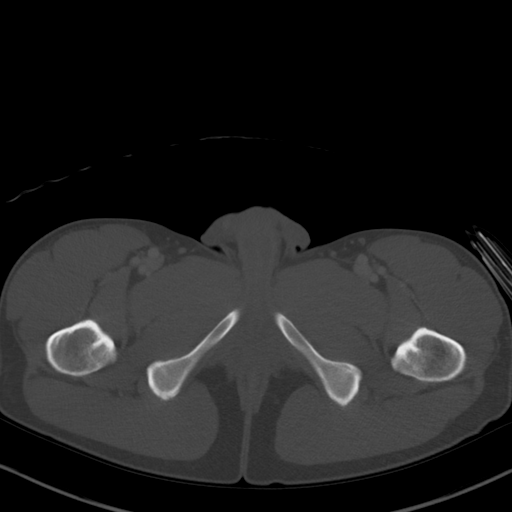
[im 14/86  soft-tissue]
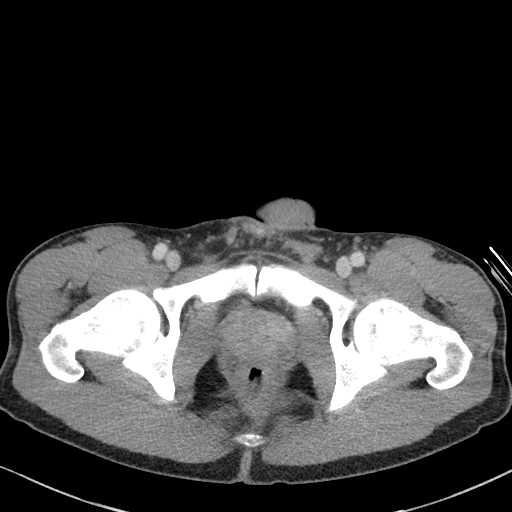
[im 21/86  soft-tissue]
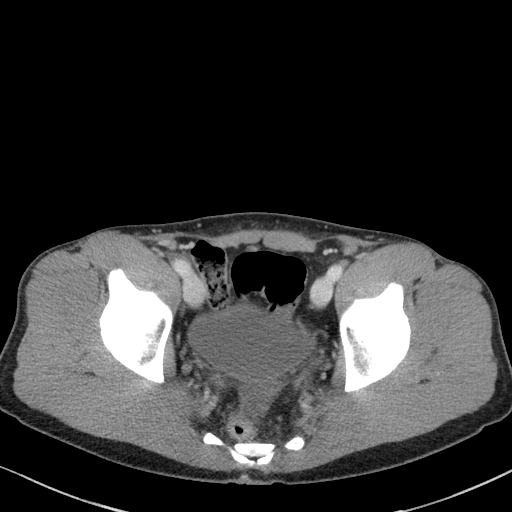
[im 28/86  soft-tissue]
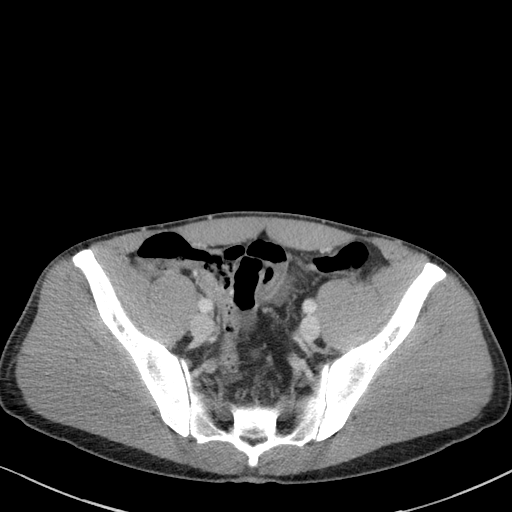
[im 35/86  soft-tissue]
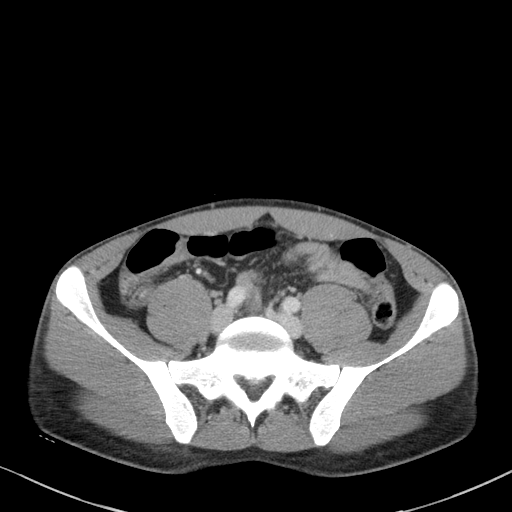
[im 41/86  soft-tissue]
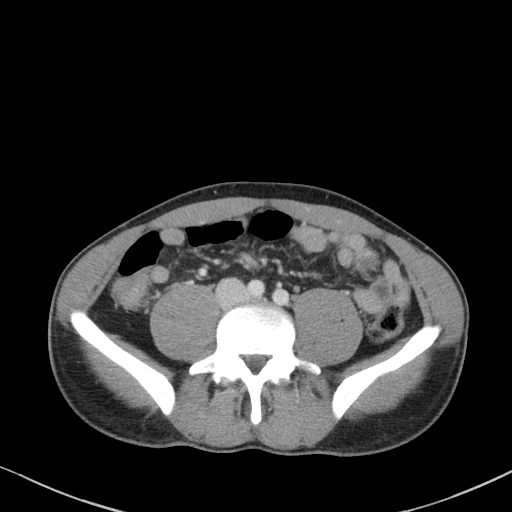
[im 48/86  soft-tissue]
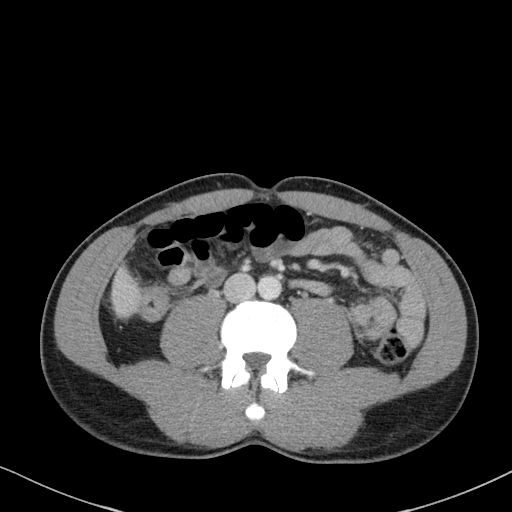
[im 55/86  soft-tissue]
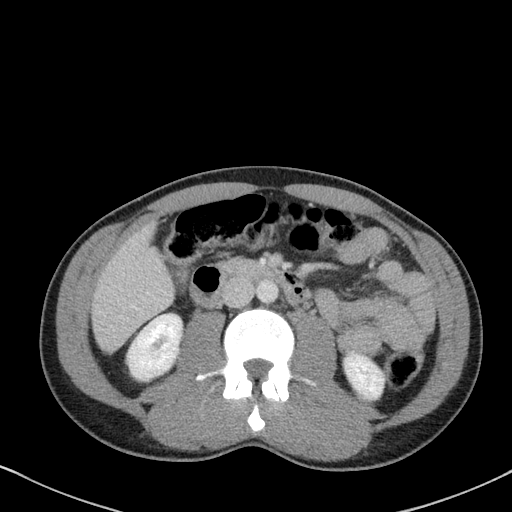
[im 62/86  soft-tissue]
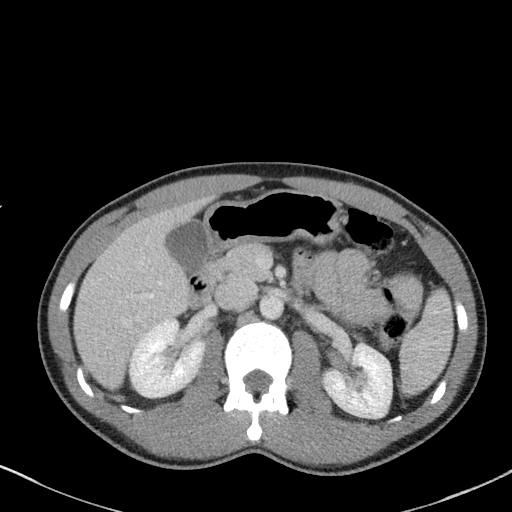
[im 62/86  bone]
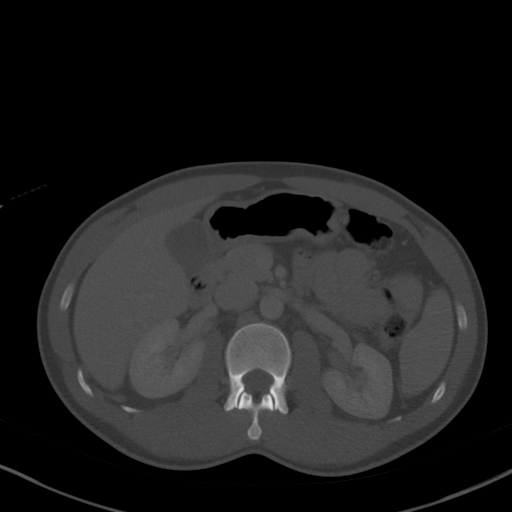
[im 69/86  soft-tissue]
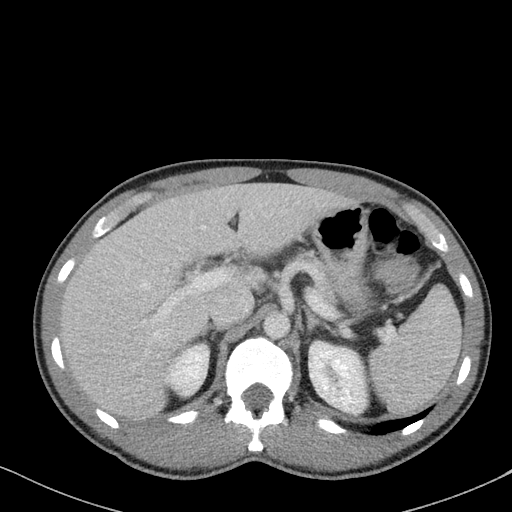
[im 75/86  soft-tissue]
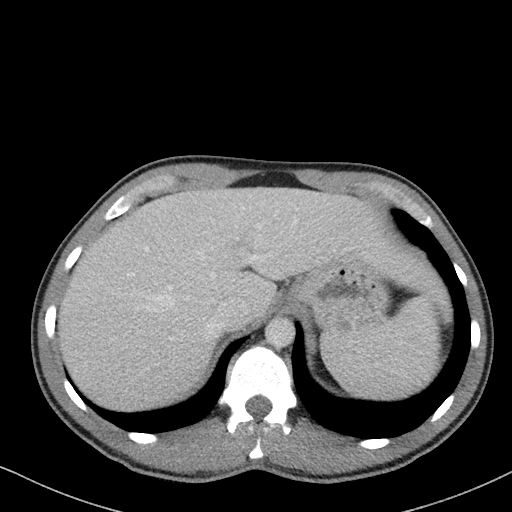
[im 82/86  soft-tissue]
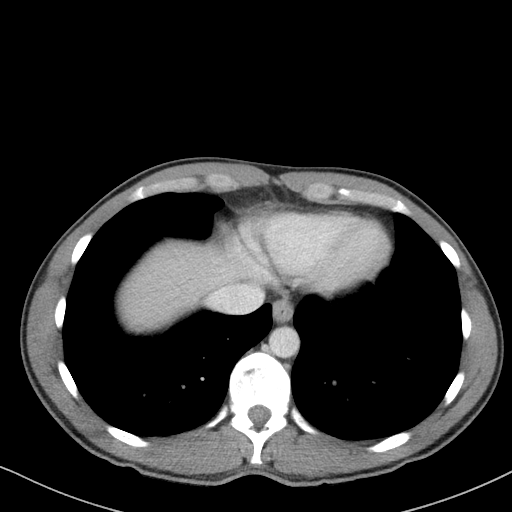

[Series 5: a/p w/ cor · coronal · 0.59mm/px · 3 of 151 slices shown]
[im 51/151  soft-tissue]
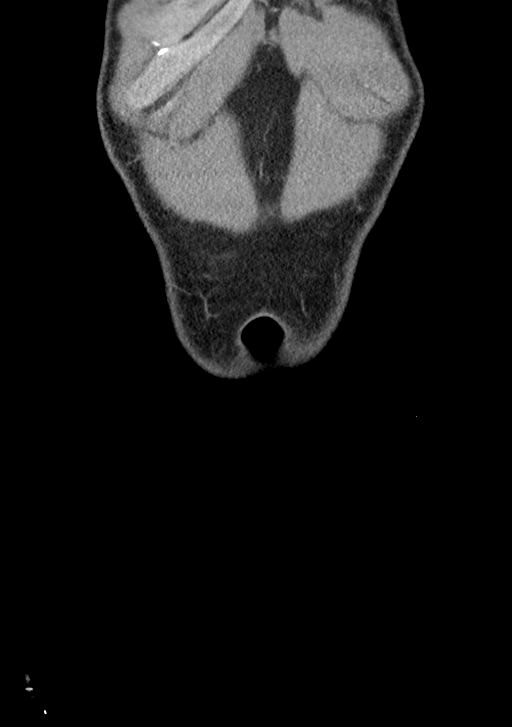
[im 67/151  soft-tissue]
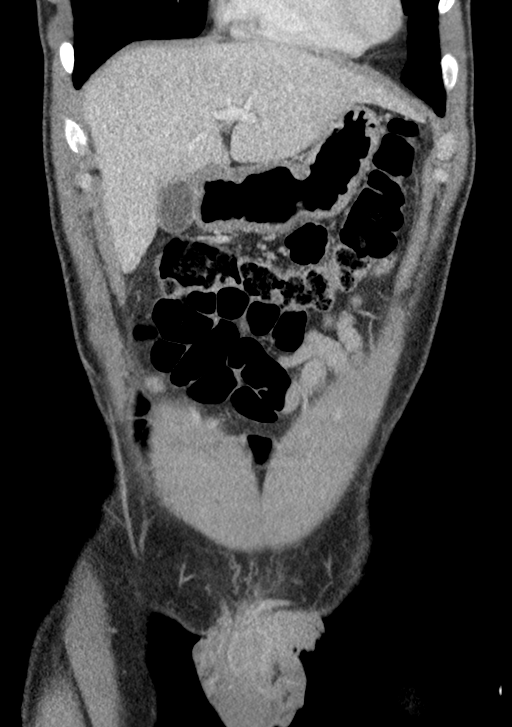
[im 84/151  soft-tissue]
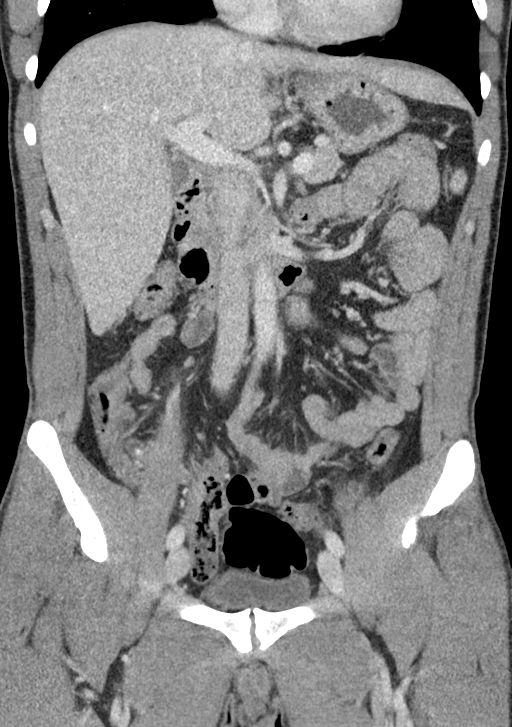

[15 of 46 positions shown; findings below may reference images not displayed]

FINDINGS: Lower chest: Lung bases are clear.

Hepatobiliary: No focal liver lesions are appreciable. The
gallbladder wall is not appreciably thickened. There is no biliary
duct dilatation.

Pancreas: There is no pancreatic mass or inflammatory focus.

Spleen: No splenic lesions are evident.

Adrenals/Urinary Tract: Adrenals bilaterally appear normal. Kidneys
bilaterally show no evident mass or hydronephrosis on either side.
There are fetal lobulations bilaterally, an anatomic variant. There
is no evident renal or ureteral calculus on either side. Urinary
bladder is midline with wall thickness within normal limits.

Stomach/Bowel: There is no appreciable bowel wall or mesenteric
thickening. There is no evident bowel obstruction. No free air or
portal venous air. The terminal ileum appears unremarkable.

Vascular/Lymphatic: No abdominal aortic aneurysm. No vascular
lesions evident. No adenopathy is appreciable in the abdomen or
pelvis.

Reproductive: Prostate is mildly prominent for age with mild
generalized increase in attenuation. Seminal vesicles appear
unremarkable. No evident pelvic mass.

Other: Appendix measures 13 mm proximally. There are several foci of
calcification in the appendix. There is mild appendiceal wall
thickening and enhancement. There is no periappendiceal region fluid
or abscess. No perforation evident.

No abscess or ascites evident in the abdomen or pelvis.

Musculoskeletal: There are no blastic or lytic bone lesions. There
is no intramuscular or abdominal wall lesion.
IMPRESSION: 1.  Findings indicative of a degree of acute appendicitis.

Appendix: Location: Appendix arises inferiorly from the cecum and
tracks anterior to the psoas muscle at the level of the mid sacrum.

Diameter: 13 mm proximally with tapering more distally.

Appendicolith: Present proximally and in the mid appendix.

Mucosal hyper-enhancement: Slight

Extraluminal gas: None

Periappendiceal collection: None. Subtle soft tissue stranding
adjacent to the proximal appendix.

2. Prostate mildly prominent for age with increased attenuation. No
focal prostatic lesion evident. This finding may warrant PSA
correlation and clinical assessment.

3.  No bowel obstruction.  No abscess in the abdomen or pelvis.

4. No renal or ureteral calculus. No hydronephrosis. Urinary bladder
wall thickness within normal limits.

Critical Value/emergent results were called by telephone at the time
of interpretation on 01/27/2019 at [DATE] to Dr. Blain Jumper , who
verbally acknowledged these results.

## 2021-04-16 ENCOUNTER — Telehealth: Payer: Self-pay

## 2021-04-16 NOTE — Telephone Encounter (Signed)
We received a new referral for this patient, previously seen by Dr Terrace Arabia in 2021. The new referral is for brain fog, memory change and fasciculation of tongue. The patient is requesting a provider switch to Dr Lucia Gaskins for these concerns.  Please advise if the switch is acceptable

## 2021-04-16 NOTE — Telephone Encounter (Signed)
Ok to switch 

## 2021-04-21 ENCOUNTER — Ambulatory Visit: Payer: Managed Care, Other (non HMO) | Admitting: Neurology

## 2021-04-21 ENCOUNTER — Encounter: Payer: Self-pay | Admitting: Neurology

## 2021-04-21 ENCOUNTER — Other Ambulatory Visit: Payer: Self-pay

## 2021-04-21 VITALS — BP 108/65 | HR 71 | Ht 66.0 in | Wt 127.0 lb

## 2021-04-21 DIAGNOSIS — G3184 Mild cognitive impairment, so stated: Secondary | ICD-10-CM

## 2021-04-21 NOTE — Progress Notes (Signed)
Chief Complaint  Patient presents with   New Patient (Initial Visit)    Rm 16, alone, Brain fog, memory change and fasciculation of tongue      ASSESSMENT AND PLAN  Jordan Klein is a 40 y.o. male   Mild cognitive impairment  In the setting of anxiety  Most likely related to his mood disorder  Will complete evaluation with MRI of the brain  Laboratory evaluation including TSH, B12, recently had CMP CBC by his primary care physician   DIAGNOSTIC DATA (LABS, IMAGING, TESTING) - I reviewed patient records, labs, notes, testing and imaging myself where available.   MEDICAL HISTORY:  Jordan Klein is a 40 year old male, seen in request by his primary care nurse practitioner for evaluation of brain fog, Jaci Lazier, Traci A, initial evaluation was on April 21, 2021  I reviewed and summarized the referring note. PMHX.  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  PHYSICAL EXAM:   Vitals:   04/21/21 1009  BP: 108/65  Pulse: 71  Weight: 127 lb (57.6 kg)  Height: 5\' 6"  (1.676 m)   Not recorded     Body mass index is 20.5 kg/m.  PHYSICAL EXAMNIATION:  Gen: NAD, conversant, well nourised, well groomed                     Cardiovascular: Regular rate rhythm, no peripheral edema, warm, nontender. Eyes: Conjunctivae clear without exudates or hemorrhage Neck: Supple, no carotid bruits. Pulmonary: Clear to auscultation bilaterally   NEUROLOGICAL EXAM:  MENTAL STATUS: Speech:    Speech  is normal; fluent and spontaneous with normal comprehension.  Cognition:     Montreal Cognitive Assessment  04/21/2021  Visuospatial/ Executive (0/5) 4  Naming (0/3) 3  Attention: Read list of digits (0/2) 2  Attention: Read list of letters (0/1) 1  Attention: Serial 7 subtraction starting at 100 (0/3) 3  Language: Repeat phrase (0/2) 2  Language : Fluency (0/1) 1  Abstraction (0/2) 2  Delayed Recall (0/5) 3  Orientation (0/6) 6  Total 27       CRANIAL NERVES: CN II: Visual fields are full to confrontation. Pupils are round equal and briskly reactive to light. CN III, IV, VI: extraocular movement are normal. No ptosis. CN V: Facial sensation is intact to light touch CN VII: Face is symmetric with normal eye closure  CN VIII: Hearing is normal to causal conversation. CN IX, X: Phonation is normal. CN XI: Head turning and shoulder shrug are intact  MOTOR: There is no pronator drift of out-stretched arms. Muscle bulk and tone are normal. Muscle strength is normal.  REFLEXES: Reflexes are 2+ and symmetric at the biceps, triceps, knees, and ankles. Plantar responses are flexor.  SENSORY: Intact to light touch, pinprick and vibratory sensation are intact in fingers and toes.  COORDINATION: There is no trunk or limb dysmetria noted.  GAIT/STANCE: Posture is normal. Gait is steady with normal steps, base, arm swing, and turning. Heel and toe walking are normal.  Tandem gait is normal.  Romberg is absent.  REVIEW OF SYSTEMS:  Full 14 system review of systems performed and notable only for as above All other review of systems were negative.   ALLERGIES: Allergies  Allergen Reactions   Banana Anaphylaxis, Itching and Swelling   Kiwi Extract Anaphylaxis, Hives and Itching   Other Anaphylaxis, Itching and Swelling    Walnuts   Penicillins Other (See Comments)    Did it involve swelling of the face/tongue/throat, SOB, or low BP? Unknown Did it involve sudden or severe  rash/hives, skin peeling, or any reaction on the inside of your mouth or nose? Unknown Did you need to seek medical attention at a hospital or doctor's office? Unknown When did it last happen? CHILDHOOD       If all above answers are "NO", may proceed with cephalosporin use.     HOME MEDICATIONS: Current Outpatient Medications  Medication Sig Dispense Refill   sertraline (ZOLOFT) 25 MG tablet Take 25 mg by mouth daily.     No current facility-administered medications for this visit.    PAST MEDICAL HISTORY: Past Medical History:  Diagnosis Date   ADHD    Fasciculation of tongue     PAST SURGICAL HISTORY: Past Surgical History:  Procedure Laterality Date   LAPAROSCOPIC APPENDECTOMY N/A 01/27/2019   Procedure: APPENDECTOMY LAPAROSCOPIC;  Surgeon: Violeta Gelinas, MD;  Location: [facility];  Service: General;  Laterality: N/A;   WISDOM TOOTH EXTRACTION      FAMILY HISTORY: History reviewed. No pertinent family history.  SOCIAL HISTORY: Social History   Socioeconomic History   Marital status: Married    Spouse name: Jushua Waltman    Number of children: 2   Years of education: College   Highest education level: Not on file  Occupational History   Not on file  Tobacco Use   Smoking status: Never   Smokeless tobacco: Never  Vaping Use   Vaping Use: Never used  Substance and Sexual Activity   Alcohol use: Yes    Comment: occasional   Drug use: Never   Sexual activity: Not on file  Other Topics Concern   Not on file  Social History Narrative   Left handed   Lives with wife   Caffeine use: About 2 cups coffee per day   Social Determinants of Health   Financial Resource Strain: Not on file  Food Insecurity: Not on file  Transportation Needs: Not on file  Physical Activity: Not on file  Stress: Not on file  Social Connections: Not on file  Intimate Partner Violence: Not on file      Levert Feinstein, M.D. Ph.D.  [facility] [address] [place], [place] [address] Ph: [phone] Fax: [phone]  CC:  Janace Aris, NP [address] [place],  [place] [address]  Gweneth Dimitri, MD

## 2021-04-22 LAB — RPR: RPR Ser Ql: NONREACTIVE

## 2021-04-22 LAB — TSH: TSH: 2.91 u[IU]/mL (ref 0.450–4.500)

## 2021-04-22 LAB — VITAMIN B12: Vitamin B-12: 429 pg/mL (ref 232–1245)

## 2021-05-05 ENCOUNTER — Telehealth: Payer: Self-pay | Admitting: Neurology

## 2021-05-05 NOTE — Telephone Encounter (Signed)
MR Brain wo contrast Dr. Ruthell Rummage: [id number] (exp. 05/05/21 to 11/01/21)  Patient is scheduled at [facility] for 05/20/21.

## 2021-05-20 ENCOUNTER — Ambulatory Visit (INDEPENDENT_AMBULATORY_CARE_PROVIDER_SITE_OTHER): Payer: Managed Care, Other (non HMO)

## 2021-05-20 DIAGNOSIS — G3184 Mild cognitive impairment, so stated: Secondary | ICD-10-CM | POA: Diagnosis not present

## 2024-04-25 ENCOUNTER — Other Ambulatory Visit (HOSPITAL_BASED_OUTPATIENT_CLINIC_OR_DEPARTMENT_OTHER): Payer: Self-pay | Admitting: Family Medicine

## 2024-04-25 DIAGNOSIS — R4189 Other symptoms and signs involving cognitive functions and awareness: Secondary | ICD-10-CM

## 2024-05-02 ENCOUNTER — Ambulatory Visit (HOSPITAL_BASED_OUTPATIENT_CLINIC_OR_DEPARTMENT_OTHER)
Admission: RE | Admit: 2024-05-02 | Discharge: 2024-05-02 | Disposition: A | Discharge status: Home / Self Care | Source: Ambulatory Visit | Attending: Family Medicine | Admitting: Family Medicine

## 2024-05-02 DIAGNOSIS — R4189 Other symptoms and signs involving cognitive functions and awareness: Secondary | ICD-10-CM | POA: Diagnosis present

## 2024-05-21 ENCOUNTER — Ambulatory Visit (HOSPITAL_BASED_OUTPATIENT_CLINIC_OR_DEPARTMENT_OTHER): Admitting: Internal Medicine

## 2024-05-21 ENCOUNTER — Encounter (HOSPITAL_BASED_OUTPATIENT_CLINIC_OR_DEPARTMENT_OTHER): Payer: Self-pay | Admitting: Internal Medicine

## 2024-05-21 VITALS — BP 110/76 | HR 64 | Ht 66.0 in | Wt 142.4 lb

## 2024-05-21 DIAGNOSIS — E7800 Pure hypercholesterolemia, unspecified: Secondary | ICD-10-CM

## 2024-05-21 DIAGNOSIS — G3184 Mild cognitive impairment, so stated: Secondary | ICD-10-CM

## 2024-05-21 NOTE — Patient Instructions (Signed)
 Medication Instructions:  No changes today *If you need a refill on your cardiac medications before your next appointment, please call your pharmacy*  Lab Work: Your physician recommends that you return for lab work at your convenience:   Lp(a)  If you have labs (blood work) drawn today and your tests are completely normal, you will receive your results only by: MyChart Message (if you have MyChart) OR A paper copy in the mail If you have any lab test that is abnormal or we need to change your treatment, we will call you to review the results.  Testing/Procedures: Calcium Score CT Scan - $99 charge  Follow-Up: Based on results

## 2024-05-21 NOTE — Progress Notes (Signed)
 LIPID CLINIC CONSULT NOTE  Chief Complaint:  Manage dyslipidemia  Primary Care Physician: Jordan Harvey, MD  Primary Cardiologist:  None  HPI:  Jordan Klein is a 43 y.o. male who is being seen today for the evaluation of dyslipidemia at the request of Jordan Artist PARAS, MD. a pleasant 43 year old male kindly referred for evaluation management of dyslipidemia.  He has family history of heart disease in his father and uncle and is concerned about cardiovascular risk.  Recently was having issues with brain fog and difficulty with cognition at work that was impairing him.  He saw neurology but is being worked up for that.  He has not been on any lipid-lowering therapies.  Labs in October showed total cholesterol 225, triglycerides 96, HDL 50 and LDL 158.  He is interested in more risk stratification.  PMHx:  Past Medical History:  Diagnosis Date   ADHD    Fasciculation of tongue     Past Surgical History:  Procedure Laterality Date   LAPAROSCOPIC APPENDECTOMY N/A 01/27/2019   Procedure: APPENDECTOMY LAPAROSCOPIC;  Surgeon: Jordan Moles, MD;  Location: [facility] OR;  Service: General;  Laterality: N/A;   WISDOM TOOTH EXTRACTION      FAMHx:  History reviewed. No pertinent family history.  SOCHx:   reports that he has never smoked. He has never used smokeless tobacco. He reports current alcohol use. He reports that he does not use drugs.  ALLERGIES:  Allergies  Allergen Reactions   Banana Anaphylaxis, Itching and Swelling   Kiwi Extract Anaphylaxis, Hives and Itching   Other Anaphylaxis, Itching and Swelling    Walnuts   Penicillins Other (See Comments)    Did it involve swelling of the face/tongue/throat, SOB, or low BP? Unknown Did it involve sudden or severe rash/hives, skin peeling, or any reaction on the inside of your mouth or nose? Unknown Did you need to seek medical attention at a hospital or doctor's office? Unknown When did it last happen? CHILDHOOD       If all  above answers are "NO", may proceed with cephalosporin use.     ROS: Pertinent items noted in HPI and remainder of comprehensive ROS otherwise negative.  HOME MEDS: Current Outpatient Medications on File Prior to Visit  Medication Sig Dispense Refill   Zinc 50 MG TABS 1 tablet Orally Once a day; Duration: 30 day(s)     sertraline (ZOLOFT) 25 MG tablet Take 25 mg by mouth daily. (Patient not taking: Reported on 05/21/2024)     No current facility-administered medications on file prior to visit.    LABS/IMAGING: No results found for this or any previous visit (from the past 48 hours). No results found.  LIPID PANEL: No results found for: CHOL, TRIG, HDL, CHOLHDL, VLDL, LDLCALC, LDLDIRECT  No results found for: LIPOA   WEIGHTS: Wt Readings from Last 3 Encounters:  05/21/24 142 lb 6.4 oz (64.6 kg)  04/21/21 127 lb (57.6 kg)  10/16/19 133 lb (60.3 kg)    VITALS: BP 110/76   Pulse 64   Ht 5' 6 (1.676 m)   Wt 142 lb 6.4 oz (64.6 kg)   SpO2 97%   BMI 22.98 kg/m   EXAM: Deferred  EKG: Deferred  ASSESSMENT: Dyslipidemia Family history of heart disease Mild cognitive impairment  PLAN: 1.   Mr. Yim has a history of dyslipidemia and family history of heart disease.  He would like further restratification and is interested in a calcium score.  This may not be  particular beneficial given his younger age however if abnormal would certainly be significantly abnormal.  A calcium score of 0 in the early 40s may not exclude cardiovascular disease.  Will also obtain an LP(a).  Based on this I would still consider lipid-lowering therapy.  Since he has had cognitive issues, we have to be careful about statin therapy which can possibly worsen this.  Another option might be Nexletol or Nexlizet which seem to not have these issues.  Will discuss this further after follow-up of his studies.  Thanks again for the kind referral.  Jordan KYM Maxcy, MD, [facility], FNLA, FACP   [place]  [facility]  Medical Director of the Advanced Lipid Disorders &  Cardiovascular Risk Reduction Clinic Diplomate of the American Board of Clinical Lipidology Attending Cardiologist  Direct Dial: [phone]  Fax: [phone]  Website:  [name] 05/21/2024, 4:20 PM

## 2024-06-07 ENCOUNTER — Ambulatory Visit (HOSPITAL_BASED_OUTPATIENT_CLINIC_OR_DEPARTMENT_OTHER)
Admission: RE | Admit: 2024-06-07 | Discharge: 2024-06-07 | Disposition: A | Discharge status: Home / Self Care | Payer: Self-pay | Source: Ambulatory Visit | Attending: Internal Medicine | Admitting: Internal Medicine

## 2024-06-07 DIAGNOSIS — E7800 Pure hypercholesterolemia, unspecified: Secondary | ICD-10-CM | POA: Insufficient documentation

## 2024-06-08 ENCOUNTER — Ambulatory Visit: Payer: Self-pay | Admitting: Internal Medicine

## 2024-06-09 LAB — LIPOPROTEIN A (LPA): Lipoprotein (a): 9.6 nmol/L (ref <75.0)

## 2024-08-16 NOTE — Telephone Encounter (Signed)
 I think we should try statins first, such as rosuvastatin 10 mg once daily, but ask him to call us  promptly if he noticed any worsening brain fog or similar cognitive issues.  Repeat a lipid profile in 3 months.  If necessary to switch due to side effects we can then use a PCSK9 inhibitor

## 2024-09-27 ENCOUNTER — Ambulatory Visit: Admitting: Neurology
